# Patient Record
Sex: Male | Born: 1935 | Race: Black or African American | Hispanic: No | Marital: Married | Smoking: Never smoker
Health system: Southern US, Community
[De-identification: ages and names within clinical notes are randomized; demographics above are authoritative.]

## PROBLEM LIST (undated history)

## (undated) DIAGNOSIS — J1100 Influenza due to unidentified influenza virus with unspecified type of pneumonia: Secondary | ICD-10-CM

## (undated) DIAGNOSIS — I10 Essential (primary) hypertension: Secondary | ICD-10-CM

## (undated) DIAGNOSIS — S14109A Unspecified injury at unspecified level of cervical spinal cord, initial encounter: Secondary | ICD-10-CM

## (undated) DIAGNOSIS — K8050 Calculus of bile duct without cholangitis or cholecystitis without obstruction: Secondary | ICD-10-CM

## (undated) DIAGNOSIS — Q453 Other congenital malformations of pancreas and pancreatic duct: Secondary | ICD-10-CM

## (undated) HISTORY — PX: CERVICAL DISC SURGERY: SHX588

## (undated) HISTORY — PX: PROSTATE SURGERY: SHX751

## (undated) HISTORY — PX: PARTIAL COLECTOMY: SHX5273

---

## 2001-11-17 ENCOUNTER — Encounter (HOSPITAL_COMMUNITY): Admission: RE | Admit: 2001-11-17 | Discharge: 2001-12-17 | Payer: Self-pay | Admitting: Family Medicine

## 2002-02-17 ENCOUNTER — Inpatient Hospital Stay (HOSPITAL_COMMUNITY): Admission: AC | Admit: 2002-02-17 | Discharge: 2002-03-05 | Payer: Self-pay

## 2002-02-17 ENCOUNTER — Encounter: Payer: Self-pay | Admitting: Surgery

## 2002-02-17 ENCOUNTER — Encounter: Payer: Self-pay | Admitting: Emergency Medicine

## 2002-02-18 ENCOUNTER — Encounter: Payer: Self-pay | Admitting: General Surgery

## 2002-02-19 ENCOUNTER — Encounter: Payer: Self-pay | Admitting: General Surgery

## 2002-02-21 ENCOUNTER — Encounter: Payer: Self-pay | Admitting: General Surgery

## 2002-02-22 ENCOUNTER — Encounter: Payer: Self-pay | Admitting: Surgery

## 2002-03-05 ENCOUNTER — Inpatient Hospital Stay (HOSPITAL_COMMUNITY)
Admission: RE | Admit: 2002-03-05 | Discharge: 2002-05-03 | Payer: Self-pay | Admitting: Physical Medicine & Rehabilitation

## 2002-03-09 ENCOUNTER — Encounter: Payer: Self-pay | Admitting: Physical Medicine & Rehabilitation

## 2002-03-15 ENCOUNTER — Encounter: Payer: Self-pay | Admitting: Physical Medicine & Rehabilitation

## 2002-03-21 ENCOUNTER — Encounter: Payer: Self-pay | Admitting: Physical Medicine & Rehabilitation

## 2002-03-23 ENCOUNTER — Encounter: Payer: Self-pay | Admitting: Physical Medicine & Rehabilitation

## 2002-03-31 ENCOUNTER — Encounter: Payer: Self-pay | Admitting: Physical Medicine & Rehabilitation

## 2002-04-04 ENCOUNTER — Encounter: Payer: Self-pay | Admitting: Physical Medicine & Rehabilitation

## 2002-04-20 ENCOUNTER — Encounter: Payer: Self-pay | Admitting: Physical Medicine & Rehabilitation

## 2002-07-03 ENCOUNTER — Encounter (HOSPITAL_COMMUNITY)
Admission: RE | Admit: 2002-07-03 | Discharge: 2002-08-02 | Payer: Self-pay | Admitting: Physical Medicine & Rehabilitation

## 2002-08-03 ENCOUNTER — Encounter
Admission: RE | Admit: 2002-08-03 | Discharge: 2002-09-02 | Payer: Self-pay | Admitting: Physical Medicine & Rehabilitation

## 2002-09-03 ENCOUNTER — Encounter (HOSPITAL_COMMUNITY)
Admission: RE | Admit: 2002-09-03 | Discharge: 2002-10-03 | Payer: Self-pay | Admitting: Physical Medicine & Rehabilitation

## 2002-10-03 ENCOUNTER — Encounter (HOSPITAL_COMMUNITY)
Admission: RE | Admit: 2002-10-03 | Discharge: 2002-11-02 | Payer: Self-pay | Admitting: Physical Medicine & Rehabilitation

## 2002-11-05 ENCOUNTER — Encounter (HOSPITAL_COMMUNITY)
Admission: RE | Admit: 2002-11-05 | Discharge: 2002-12-05 | Payer: Self-pay | Admitting: Physical Medicine & Rehabilitation

## 2002-11-16 ENCOUNTER — Encounter
Admission: RE | Admit: 2002-11-16 | Discharge: 2003-02-14 | Payer: Self-pay | Admitting: Physical Medicine & Rehabilitation

## 2002-11-16 ENCOUNTER — Encounter: Payer: Self-pay | Admitting: *Deleted

## 2002-11-16 ENCOUNTER — Emergency Department (HOSPITAL_COMMUNITY): Admission: EM | Admit: 2002-11-16 | Discharge: 2002-11-16 | Payer: Self-pay | Admitting: *Deleted

## 2002-12-05 ENCOUNTER — Encounter (HOSPITAL_COMMUNITY)
Admission: RE | Admit: 2002-12-05 | Discharge: 2003-01-04 | Payer: Self-pay | Admitting: Physical Medicine & Rehabilitation

## 2003-01-07 ENCOUNTER — Encounter (HOSPITAL_COMMUNITY)
Admission: RE | Admit: 2003-01-07 | Discharge: 2003-02-06 | Payer: Self-pay | Admitting: Physical Medicine & Rehabilitation

## 2003-02-08 ENCOUNTER — Encounter (HOSPITAL_COMMUNITY)
Admission: RE | Admit: 2003-02-08 | Discharge: 2003-03-10 | Payer: Self-pay | Admitting: Physical Medicine & Rehabilitation

## 2003-03-11 ENCOUNTER — Encounter (HOSPITAL_COMMUNITY)
Admission: RE | Admit: 2003-03-11 | Discharge: 2003-04-10 | Payer: Self-pay | Admitting: Physical Medicine & Rehabilitation

## 2003-03-25 ENCOUNTER — Ambulatory Visit (HOSPITAL_COMMUNITY): Admission: RE | Admit: 2003-03-25 | Discharge: 2003-03-25 | Payer: Self-pay | Admitting: Urology

## 2003-03-25 ENCOUNTER — Encounter: Payer: Self-pay | Admitting: Urology

## 2003-04-11 ENCOUNTER — Encounter: Payer: Self-pay | Admitting: Urology

## 2003-04-11 ENCOUNTER — Ambulatory Visit (HOSPITAL_COMMUNITY): Admission: RE | Admit: 2003-04-11 | Discharge: 2003-04-11 | Payer: Self-pay | Admitting: Urology

## 2003-04-12 ENCOUNTER — Encounter (HOSPITAL_COMMUNITY)
Admission: RE | Admit: 2003-04-12 | Discharge: 2003-05-12 | Payer: Self-pay | Admitting: Physical Medicine & Rehabilitation

## 2003-04-18 ENCOUNTER — Encounter
Admission: RE | Admit: 2003-04-18 | Discharge: 2003-07-17 | Payer: Self-pay | Admitting: Physical Medicine & Rehabilitation

## 2003-05-13 ENCOUNTER — Inpatient Hospital Stay (HOSPITAL_COMMUNITY): Admission: AD | Admit: 2003-05-13 | Discharge: 2003-05-15 | Payer: Self-pay | Admitting: Urology

## 2003-05-16 ENCOUNTER — Encounter (HOSPITAL_COMMUNITY): Admission: RE | Admit: 2003-05-16 | Discharge: 2003-06-15 | Payer: Self-pay | Admitting: Urology

## 2003-06-23 ENCOUNTER — Emergency Department (HOSPITAL_COMMUNITY): Admission: EM | Admit: 2003-06-23 | Discharge: 2003-06-23 | Payer: Self-pay | Admitting: Emergency Medicine

## 2003-07-18 ENCOUNTER — Encounter
Admission: RE | Admit: 2003-07-18 | Discharge: 2003-10-16 | Payer: Self-pay | Admitting: Physical Medicine & Rehabilitation

## 2003-10-22 ENCOUNTER — Encounter
Admission: RE | Admit: 2003-10-22 | Discharge: 2004-01-20 | Payer: Self-pay | Admitting: Physical Medicine & Rehabilitation

## 2003-12-23 ENCOUNTER — Ambulatory Visit (HOSPITAL_COMMUNITY): Admission: RE | Admit: 2003-12-23 | Discharge: 2003-12-23 | Payer: Self-pay | Admitting: Family Medicine

## 2003-12-25 ENCOUNTER — Inpatient Hospital Stay (HOSPITAL_COMMUNITY): Admission: EM | Admit: 2003-12-25 | Discharge: 2004-01-04 | Payer: Self-pay | Admitting: Emergency Medicine

## 2004-01-16 ENCOUNTER — Ambulatory Visit (HOSPITAL_COMMUNITY): Admission: RE | Admit: 2004-01-16 | Discharge: 2004-01-16 | Payer: Self-pay | Admitting: Family Medicine

## 2004-02-20 ENCOUNTER — Encounter
Admission: RE | Admit: 2004-02-20 | Discharge: 2004-05-20 | Payer: Self-pay | Admitting: Physical Medicine & Rehabilitation

## 2004-03-17 ENCOUNTER — Encounter
Admission: RE | Admit: 2004-03-17 | Discharge: 2004-06-15 | Payer: Self-pay | Admitting: Physical Medicine & Rehabilitation

## 2004-03-19 ENCOUNTER — Ambulatory Visit: Payer: Self-pay | Admitting: Physical Medicine & Rehabilitation

## 2004-05-21 ENCOUNTER — Encounter
Admission: RE | Admit: 2004-05-21 | Discharge: 2004-05-22 | Payer: Self-pay | Admitting: Physical Medicine & Rehabilitation

## 2004-07-14 ENCOUNTER — Encounter
Admission: RE | Admit: 2004-07-14 | Discharge: 2004-10-12 | Payer: Self-pay | Admitting: Physical Medicine & Rehabilitation

## 2004-11-10 ENCOUNTER — Encounter
Admission: RE | Admit: 2004-11-10 | Discharge: 2005-02-08 | Payer: Self-pay | Admitting: Physical Medicine & Rehabilitation

## 2004-11-12 ENCOUNTER — Ambulatory Visit: Payer: Self-pay | Admitting: Physical Medicine & Rehabilitation

## 2004-12-11 ENCOUNTER — Encounter (HOSPITAL_BASED_OUTPATIENT_CLINIC_OR_DEPARTMENT_OTHER): Admission: RE | Admit: 2004-12-11 | Discharge: 2005-03-11 | Payer: Self-pay | Admitting: Surgery

## 2005-01-15 ENCOUNTER — Emergency Department (HOSPITAL_COMMUNITY): Admission: EM | Admit: 2005-01-15 | Discharge: 2005-01-15 | Payer: Self-pay | Admitting: Emergency Medicine

## 2005-02-21 IMAGING — CR DG CHEST 1V
1 series · 1 of 1 positions shown · non-contrast
Comparison: 01/01/2004.

CLINICAL DATA: Followup pneumonia 
 CHEST (ONE VIEW)

[view not recorded]
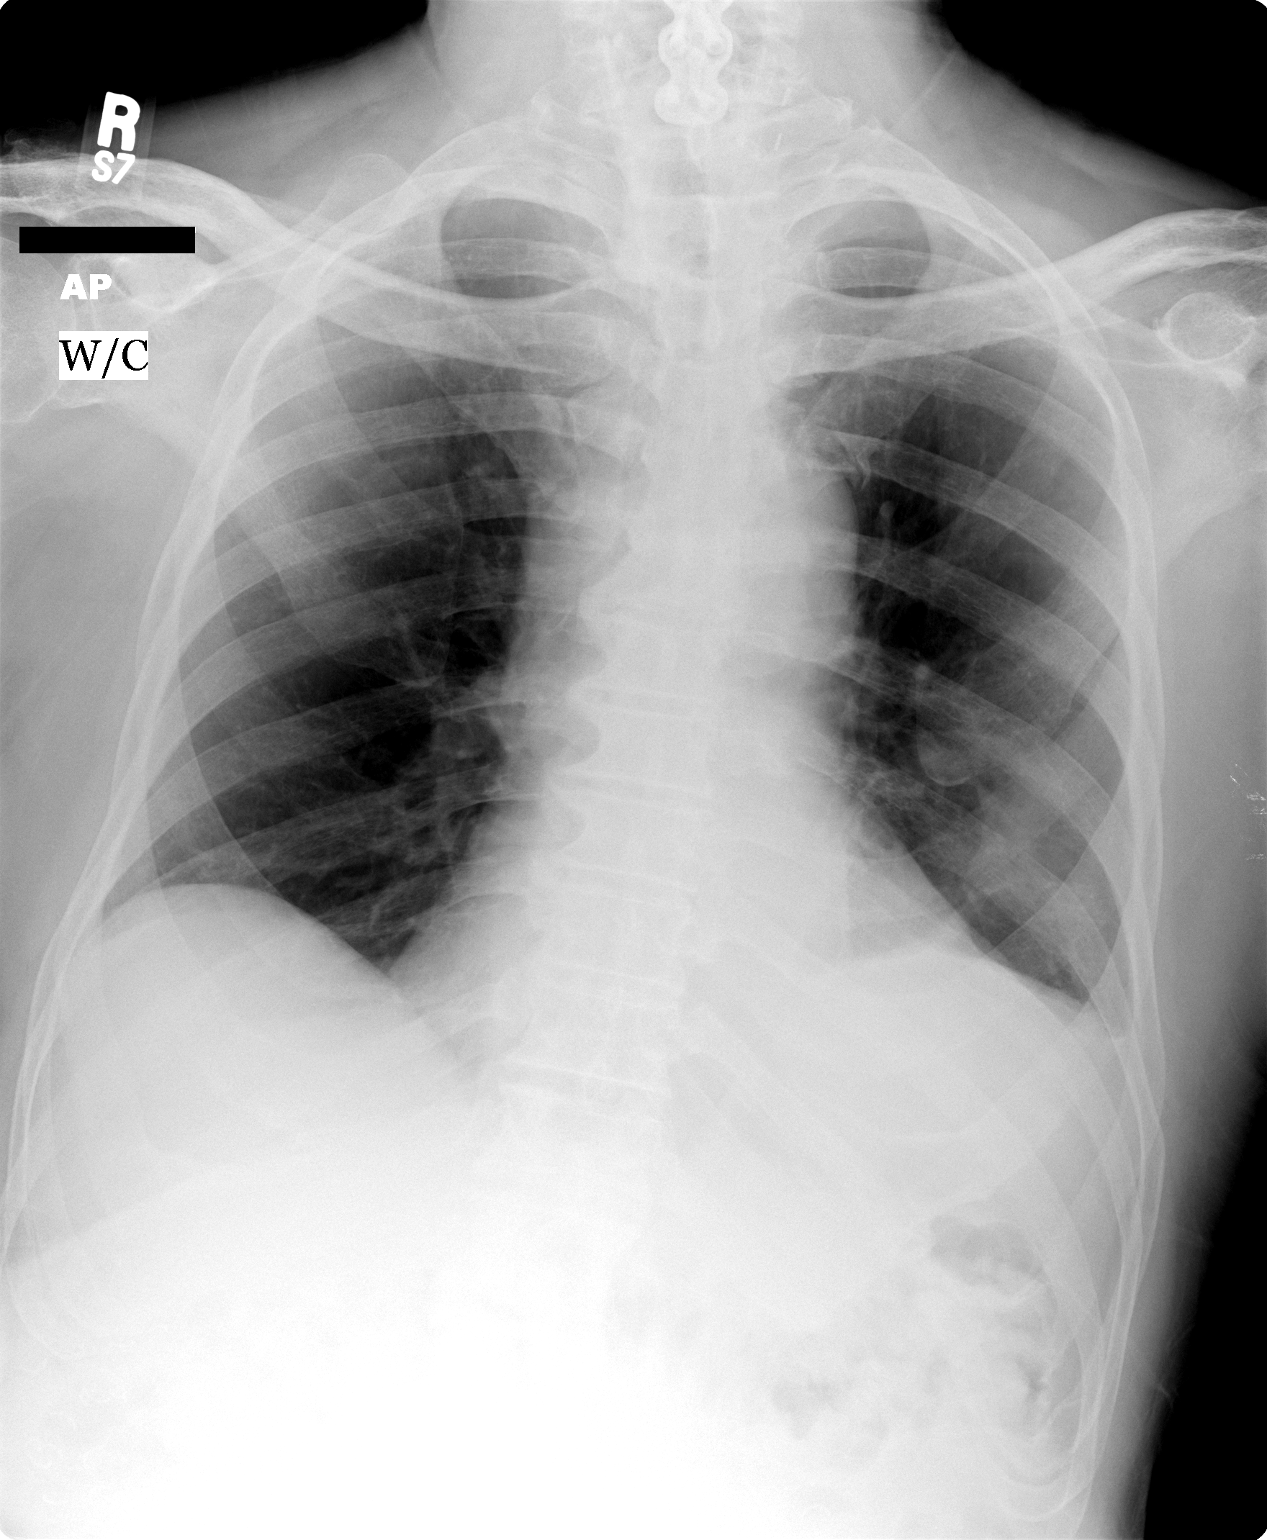

[1 of 1 positions shown; findings below may reference images not displayed]

Upper normal heart size.  Stable mediastinal contours and vascularity.  Minimal left base atelectasis with improved aeration of left lower lobe and near complete resolution of left lower lobe process since previous exam. Small left pleural effusion subpulmonic persists.  Remaining lungs clear.  
 IMPRESSION
 Improved aeration left lower lobe though minimal basilar atelectasis and small subpulmonic left pleural effusion persists.

## 2005-03-17 ENCOUNTER — Ambulatory Visit: Payer: Self-pay | Admitting: Physical Medicine & Rehabilitation

## 2005-03-17 ENCOUNTER — Encounter
Admission: RE | Admit: 2005-03-17 | Discharge: 2005-06-15 | Payer: Self-pay | Admitting: Physical Medicine & Rehabilitation

## 2005-06-10 ENCOUNTER — Ambulatory Visit (HOSPITAL_COMMUNITY): Admission: RE | Admit: 2005-06-10 | Discharge: 2005-06-10 | Payer: Self-pay | Admitting: Family Medicine

## 2005-09-08 ENCOUNTER — Encounter
Admission: RE | Admit: 2005-09-08 | Discharge: 2005-12-07 | Payer: Self-pay | Admitting: Physical Medicine & Rehabilitation

## 2005-09-08 ENCOUNTER — Ambulatory Visit: Payer: Self-pay | Admitting: Physical Medicine & Rehabilitation

## 2006-03-02 ENCOUNTER — Encounter
Admission: RE | Admit: 2006-03-02 | Discharge: 2006-05-31 | Payer: Self-pay | Admitting: Physical Medicine & Rehabilitation

## 2006-03-15 ENCOUNTER — Ambulatory Visit: Payer: Self-pay | Admitting: Family Medicine

## 2006-03-28 ENCOUNTER — Ambulatory Visit (HOSPITAL_COMMUNITY): Admission: RE | Admit: 2006-03-28 | Discharge: 2006-03-28 | Payer: Self-pay | Admitting: Family Medicine

## 2006-03-30 ENCOUNTER — Encounter (INDEPENDENT_AMBULATORY_CARE_PROVIDER_SITE_OTHER): Payer: Self-pay | Admitting: Family Medicine

## 2006-03-30 LAB — CONVERTED CEMR LAB: WBC, blood: 8.5 10*3/uL

## 2006-04-01 ENCOUNTER — Ambulatory Visit: Payer: Self-pay | Admitting: Family Medicine

## 2006-04-14 ENCOUNTER — Ambulatory Visit: Payer: Self-pay | Admitting: Physical Medicine & Rehabilitation

## 2006-04-29 ENCOUNTER — Ambulatory Visit: Payer: Self-pay | Admitting: Family Medicine

## 2006-05-09 ENCOUNTER — Encounter (INDEPENDENT_AMBULATORY_CARE_PROVIDER_SITE_OTHER): Payer: Self-pay | Admitting: Family Medicine

## 2006-05-09 ENCOUNTER — Ambulatory Visit (HOSPITAL_COMMUNITY): Admission: RE | Admit: 2006-05-09 | Discharge: 2006-05-09 | Payer: Self-pay | Admitting: Family Medicine

## 2006-05-27 ENCOUNTER — Ambulatory Visit: Payer: Self-pay | Admitting: Family Medicine

## 2006-06-06 ENCOUNTER — Encounter: Payer: Self-pay | Admitting: Family Medicine

## 2006-06-06 DIAGNOSIS — F329 Major depressive disorder, single episode, unspecified: Secondary | ICD-10-CM

## 2006-06-06 DIAGNOSIS — N319 Neuromuscular dysfunction of bladder, unspecified: Secondary | ICD-10-CM

## 2006-06-06 DIAGNOSIS — R32 Unspecified urinary incontinence: Secondary | ICD-10-CM | POA: Insufficient documentation

## 2006-06-06 DIAGNOSIS — K5090 Crohn's disease, unspecified, without complications: Secondary | ICD-10-CM | POA: Insufficient documentation

## 2006-06-06 DIAGNOSIS — N183 Chronic kidney disease, stage 3 (moderate): Secondary | ICD-10-CM

## 2006-06-06 DIAGNOSIS — K589 Irritable bowel syndrome without diarrhea: Secondary | ICD-10-CM

## 2006-06-06 DIAGNOSIS — E785 Hyperlipidemia, unspecified: Secondary | ICD-10-CM

## 2006-06-06 DIAGNOSIS — IMO0002 Reserved for concepts with insufficient information to code with codable children: Secondary | ICD-10-CM

## 2006-06-27 ENCOUNTER — Ambulatory Visit: Payer: Self-pay | Admitting: Family Medicine

## 2006-06-30 ENCOUNTER — Emergency Department (HOSPITAL_COMMUNITY): Admission: EM | Admit: 2006-06-30 | Discharge: 2006-06-30 | Payer: Self-pay | Admitting: Emergency Medicine

## 2006-07-01 ENCOUNTER — Inpatient Hospital Stay (HOSPITAL_COMMUNITY): Admission: EM | Admit: 2006-07-01 | Discharge: 2006-07-05 | Payer: Self-pay | Admitting: Emergency Medicine

## 2006-08-05 ENCOUNTER — Ambulatory Visit: Payer: Self-pay | Admitting: Family Medicine

## 2006-09-01 ENCOUNTER — Ambulatory Visit: Payer: Self-pay | Admitting: Family Medicine

## 2006-09-08 ENCOUNTER — Encounter (INDEPENDENT_AMBULATORY_CARE_PROVIDER_SITE_OTHER): Payer: Self-pay | Admitting: Family Medicine

## 2006-09-08 DIAGNOSIS — G8250 Quadriplegia, unspecified: Secondary | ICD-10-CM

## 2006-09-09 ENCOUNTER — Telehealth (INDEPENDENT_AMBULATORY_CARE_PROVIDER_SITE_OTHER): Payer: Self-pay | Admitting: Family Medicine

## 2006-09-28 ENCOUNTER — Encounter
Admission: RE | Admit: 2006-09-28 | Discharge: 2006-12-27 | Payer: Self-pay | Admitting: Physical Medicine & Rehabilitation

## 2006-09-28 ENCOUNTER — Ambulatory Visit: Payer: Self-pay | Admitting: Physical Medicine & Rehabilitation

## 2006-10-12 ENCOUNTER — Ambulatory Visit: Payer: Self-pay | Admitting: Family Medicine

## 2006-10-21 ENCOUNTER — Telehealth (INDEPENDENT_AMBULATORY_CARE_PROVIDER_SITE_OTHER): Payer: Self-pay | Admitting: Family Medicine

## 2006-10-26 ENCOUNTER — Encounter (INDEPENDENT_AMBULATORY_CARE_PROVIDER_SITE_OTHER): Payer: Self-pay | Admitting: Family Medicine

## 2006-10-26 ENCOUNTER — Encounter (HOSPITAL_COMMUNITY): Admission: RE | Admit: 2006-10-26 | Discharge: 2006-11-25 | Payer: Self-pay | Admitting: Family Medicine

## 2006-11-02 ENCOUNTER — Encounter (INDEPENDENT_AMBULATORY_CARE_PROVIDER_SITE_OTHER): Payer: Self-pay | Admitting: Family Medicine

## 2006-11-04 ENCOUNTER — Encounter (INDEPENDENT_AMBULATORY_CARE_PROVIDER_SITE_OTHER): Payer: Self-pay | Admitting: Family Medicine

## 2006-11-17 ENCOUNTER — Encounter (INDEPENDENT_AMBULATORY_CARE_PROVIDER_SITE_OTHER): Payer: Self-pay | Admitting: Family Medicine

## 2006-11-24 ENCOUNTER — Encounter (INDEPENDENT_AMBULATORY_CARE_PROVIDER_SITE_OTHER): Payer: Self-pay | Admitting: Family Medicine

## 2006-12-12 ENCOUNTER — Encounter (INDEPENDENT_AMBULATORY_CARE_PROVIDER_SITE_OTHER): Payer: Self-pay | Admitting: Family Medicine

## 2006-12-21 ENCOUNTER — Encounter (INDEPENDENT_AMBULATORY_CARE_PROVIDER_SITE_OTHER): Payer: Self-pay | Admitting: Family Medicine

## 2007-01-09 ENCOUNTER — Encounter (INDEPENDENT_AMBULATORY_CARE_PROVIDER_SITE_OTHER): Payer: Self-pay | Admitting: Family Medicine

## 2007-01-11 ENCOUNTER — Encounter (INDEPENDENT_AMBULATORY_CARE_PROVIDER_SITE_OTHER): Payer: Self-pay | Admitting: Family Medicine

## 2007-01-12 ENCOUNTER — Ambulatory Visit: Payer: Self-pay | Admitting: Family Medicine

## 2007-01-31 ENCOUNTER — Encounter: Admission: RE | Admit: 2007-01-31 | Discharge: 2007-01-31 | Payer: Self-pay | Admitting: Family Medicine

## 2007-02-03 ENCOUNTER — Telehealth (INDEPENDENT_AMBULATORY_CARE_PROVIDER_SITE_OTHER): Payer: Self-pay | Admitting: *Deleted

## 2007-02-06 ENCOUNTER — Telehealth (INDEPENDENT_AMBULATORY_CARE_PROVIDER_SITE_OTHER): Payer: Self-pay | Admitting: *Deleted

## 2007-02-06 ENCOUNTER — Ambulatory Visit: Payer: Self-pay | Admitting: Family Medicine

## 2007-02-07 ENCOUNTER — Telehealth (INDEPENDENT_AMBULATORY_CARE_PROVIDER_SITE_OTHER): Payer: Self-pay | Admitting: *Deleted

## 2007-02-07 LAB — CONVERTED CEMR LAB
ALT: 25 units/L (ref 0–53)
AST: 28 units/L (ref 0–37)
Alkaline Phosphatase: 108 units/L (ref 39–117)
Basophils Absolute: 0 10*3/uL (ref 0.0–0.1)
CO2: 18 meq/L — ABNORMAL LOW (ref 19–32)
Chloride: 106 meq/L (ref 96–112)
Creatinine, Ser: 1.92 mg/dL — ABNORMAL HIGH (ref 0.40–1.50)
Hemoglobin: 16 g/dL (ref 13.0–17.0)
Lymphocytes Relative: 35 % (ref 12–46)
Monocytes Absolute: 0.7 10*3/uL (ref 0.2–0.7)
Monocytes Relative: 10 % (ref 3–11)
Neutro Abs: 3.5 10*3/uL (ref 1.7–7.7)
Neutrophils Relative %: 51 % (ref 43–77)
Platelets: 182 10*3/uL (ref 150–400)
RDW: 14.6 % — ABNORMAL HIGH (ref 11.5–14.0)
Total Bilirubin: 1 mg/dL (ref 0.3–1.2)
WBC: 6.8 10*3/uL (ref 4.0–10.5)

## 2007-02-09 ENCOUNTER — Encounter (INDEPENDENT_AMBULATORY_CARE_PROVIDER_SITE_OTHER): Payer: Self-pay | Admitting: Family Medicine

## 2007-02-12 ENCOUNTER — Encounter (INDEPENDENT_AMBULATORY_CARE_PROVIDER_SITE_OTHER): Payer: Self-pay | Admitting: Family Medicine

## 2007-02-13 ENCOUNTER — Encounter (INDEPENDENT_AMBULATORY_CARE_PROVIDER_SITE_OTHER): Payer: Self-pay | Admitting: Family Medicine

## 2007-02-20 ENCOUNTER — Ambulatory Visit: Payer: Self-pay | Admitting: Family Medicine

## 2007-02-23 ENCOUNTER — Encounter (INDEPENDENT_AMBULATORY_CARE_PROVIDER_SITE_OTHER): Payer: Self-pay | Admitting: Family Medicine

## 2007-03-15 ENCOUNTER — Ambulatory Visit: Payer: Self-pay | Admitting: Physical Medicine & Rehabilitation

## 2007-03-15 ENCOUNTER — Encounter
Admission: RE | Admit: 2007-03-15 | Discharge: 2007-03-16 | Payer: Self-pay | Admitting: Physical Medicine & Rehabilitation

## 2007-03-31 ENCOUNTER — Encounter (INDEPENDENT_AMBULATORY_CARE_PROVIDER_SITE_OTHER): Payer: Self-pay | Admitting: Family Medicine

## 2007-04-03 ENCOUNTER — Ambulatory Visit: Payer: Self-pay | Admitting: Family Medicine

## 2007-04-03 LAB — CONVERTED CEMR LAB
Cholesterol, target level: 200 mg/dL
LDL Goal: 160 mg/dL

## 2007-04-10 ENCOUNTER — Ambulatory Visit: Payer: Self-pay | Admitting: Family Medicine

## 2007-04-10 ENCOUNTER — Telehealth (INDEPENDENT_AMBULATORY_CARE_PROVIDER_SITE_OTHER): Payer: Self-pay | Admitting: Family Medicine

## 2007-04-12 ENCOUNTER — Encounter (INDEPENDENT_AMBULATORY_CARE_PROVIDER_SITE_OTHER): Payer: Self-pay | Admitting: Family Medicine

## 2007-04-12 LAB — CONVERTED CEMR LAB

## 2007-05-01 ENCOUNTER — Telehealth (INDEPENDENT_AMBULATORY_CARE_PROVIDER_SITE_OTHER): Payer: Self-pay | Admitting: *Deleted

## 2007-05-02 ENCOUNTER — Ambulatory Visit: Payer: Self-pay | Admitting: Family Medicine

## 2007-05-16 ENCOUNTER — Ambulatory Visit: Payer: Self-pay | Admitting: Family Medicine

## 2007-05-17 ENCOUNTER — Encounter (INDEPENDENT_AMBULATORY_CARE_PROVIDER_SITE_OTHER): Payer: Self-pay | Admitting: Family Medicine

## 2007-05-17 ENCOUNTER — Telehealth (INDEPENDENT_AMBULATORY_CARE_PROVIDER_SITE_OTHER): Payer: Self-pay | Admitting: *Deleted

## 2007-05-18 ENCOUNTER — Encounter (INDEPENDENT_AMBULATORY_CARE_PROVIDER_SITE_OTHER): Payer: Self-pay | Admitting: Family Medicine

## 2007-05-22 ENCOUNTER — Ambulatory Visit (HOSPITAL_COMMUNITY): Admission: RE | Admit: 2007-05-22 | Discharge: 2007-05-22 | Payer: Self-pay | Admitting: Family Medicine

## 2007-05-22 ENCOUNTER — Encounter (INDEPENDENT_AMBULATORY_CARE_PROVIDER_SITE_OTHER): Payer: Self-pay | Admitting: Family Medicine

## 2007-05-23 ENCOUNTER — Telehealth (INDEPENDENT_AMBULATORY_CARE_PROVIDER_SITE_OTHER): Payer: Self-pay | Admitting: *Deleted

## 2007-05-23 LAB — CONVERTED CEMR LAB
ALT: 19 units/L (ref 0–53)
AST: 20 units/L (ref 0–37)
Albumin: 4.1 g/dL (ref 3.5–5.2)
Alkaline Phosphatase: 112 units/L (ref 39–117)
Amylase: 77 units/L (ref 0–105)
BUN: 18 mg/dL (ref 6–23)
Chloride: 105 meq/L (ref 96–112)
Cholesterol: 145 mg/dL (ref 0–200)
Glucose, Bld: 81 mg/dL (ref 70–99)
HCT: 49.7 % (ref 39.0–52.0)
Lymphocytes Relative: 34 % (ref 12–46)
Lymphs Abs: 2.6 10*3/uL (ref 0.7–4.0)
MCV: 89.7 fL (ref 78.0–100.0)
Monocytes Absolute: 0.5 10*3/uL (ref 0.1–1.0)
Neutrophils Relative %: 56 % (ref 43–77)
Platelets: 172 10*3/uL (ref 150–400)
Potassium: 4.7 meq/L (ref 3.5–5.3)
RBC: 5.54 M/uL (ref 4.22–5.81)
Sodium: 140 meq/L (ref 135–145)
Total CHOL/HDL Ratio: 2.8
Total Protein: 7.2 g/dL (ref 6.0–8.3)

## 2007-05-30 ENCOUNTER — Ambulatory Visit (HOSPITAL_COMMUNITY): Admission: RE | Admit: 2007-05-30 | Discharge: 2007-05-30 | Payer: Self-pay | Admitting: Family Medicine

## 2007-05-30 ENCOUNTER — Encounter (INDEPENDENT_AMBULATORY_CARE_PROVIDER_SITE_OTHER): Payer: Self-pay | Admitting: Family Medicine

## 2007-06-16 ENCOUNTER — Telehealth (INDEPENDENT_AMBULATORY_CARE_PROVIDER_SITE_OTHER): Payer: Self-pay | Admitting: Family Medicine

## 2007-06-21 ENCOUNTER — Telehealth (INDEPENDENT_AMBULATORY_CARE_PROVIDER_SITE_OTHER): Payer: Self-pay | Admitting: *Deleted

## 2007-06-21 ENCOUNTER — Encounter (INDEPENDENT_AMBULATORY_CARE_PROVIDER_SITE_OTHER): Payer: Self-pay | Admitting: Family Medicine

## 2007-06-21 DIAGNOSIS — J984 Other disorders of lung: Secondary | ICD-10-CM | POA: Insufficient documentation

## 2007-06-26 ENCOUNTER — Encounter (INDEPENDENT_AMBULATORY_CARE_PROVIDER_SITE_OTHER): Payer: Self-pay | Admitting: Family Medicine

## 2007-07-10 ENCOUNTER — Ambulatory Visit: Payer: Self-pay | Admitting: Family Medicine

## 2007-07-11 ENCOUNTER — Telehealth (INDEPENDENT_AMBULATORY_CARE_PROVIDER_SITE_OTHER): Payer: Self-pay | Admitting: *Deleted

## 2007-07-11 ENCOUNTER — Telehealth (INDEPENDENT_AMBULATORY_CARE_PROVIDER_SITE_OTHER): Payer: Self-pay | Admitting: Family Medicine

## 2007-07-20 ENCOUNTER — Ambulatory Visit (HOSPITAL_COMMUNITY): Admission: RE | Admit: 2007-07-20 | Discharge: 2007-07-20 | Payer: Self-pay | Admitting: Pulmonary Disease

## 2007-07-24 ENCOUNTER — Encounter (INDEPENDENT_AMBULATORY_CARE_PROVIDER_SITE_OTHER): Payer: Self-pay | Admitting: Family Medicine

## 2007-08-15 ENCOUNTER — Encounter (INDEPENDENT_AMBULATORY_CARE_PROVIDER_SITE_OTHER): Payer: Self-pay | Admitting: Family Medicine

## 2007-08-18 ENCOUNTER — Encounter (INDEPENDENT_AMBULATORY_CARE_PROVIDER_SITE_OTHER): Payer: Self-pay | Admitting: Family Medicine

## 2007-08-21 ENCOUNTER — Ambulatory Visit: Payer: Self-pay | Admitting: Family Medicine

## 2007-08-24 ENCOUNTER — Ambulatory Visit: Payer: Self-pay | Admitting: Family Medicine

## 2007-08-24 DIAGNOSIS — L89309 Pressure ulcer of unspecified buttock, unspecified stage: Secondary | ICD-10-CM | POA: Insufficient documentation

## 2007-08-27 ENCOUNTER — Encounter (INDEPENDENT_AMBULATORY_CARE_PROVIDER_SITE_OTHER): Payer: Self-pay | Admitting: Family Medicine

## 2007-09-19 ENCOUNTER — Encounter (INDEPENDENT_AMBULATORY_CARE_PROVIDER_SITE_OTHER): Payer: Self-pay | Admitting: Family Medicine

## 2007-09-22 ENCOUNTER — Ambulatory Visit: Payer: Self-pay | Admitting: Family Medicine

## 2007-09-22 LAB — CONVERTED CEMR LAB
Bilirubin Urine: NEGATIVE
Ketones, urine, test strip: NEGATIVE
Specific Gravity, Urine: 1.01
Urobilinogen, UA: 0.2

## 2007-09-23 ENCOUNTER — Encounter (INDEPENDENT_AMBULATORY_CARE_PROVIDER_SITE_OTHER): Payer: Self-pay | Admitting: Family Medicine

## 2007-09-23 LAB — CONVERTED CEMR LAB
AST: 25 units/L (ref 0–37)
Alkaline Phosphatase: 125 units/L — ABNORMAL HIGH (ref 39–117)
CO2: 16 meq/L — ABNORMAL LOW (ref 19–32)
Calcium: 8.9 mg/dL (ref 8.4–10.5)
Creatinine, Ser: 1.48 mg/dL (ref 0.40–1.50)
Eosinophils Absolute: 0.2 10*3/uL (ref 0.0–0.7)
Eosinophils Relative: 3 % (ref 0–5)
Glucose, Bld: 82 mg/dL (ref 70–99)
HCT: 43.8 % (ref 39.0–52.0)
MCHC: 35.2 g/dL (ref 30.0–36.0)
MCV: 87.3 fL (ref 78.0–100.0)
Monocytes Relative: 8 % (ref 3–12)
Potassium: 4.3 meq/L (ref 3.5–5.3)
RDW: 14.1 % (ref 11.5–15.5)
Sodium: 139 meq/L (ref 135–145)
Total Bilirubin: 1.1 mg/dL (ref 0.3–1.2)
Total Protein: 7.4 g/dL (ref 6.0–8.3)

## 2007-09-25 ENCOUNTER — Telehealth (INDEPENDENT_AMBULATORY_CARE_PROVIDER_SITE_OTHER): Payer: Self-pay | Admitting: *Deleted

## 2007-09-26 ENCOUNTER — Telehealth (INDEPENDENT_AMBULATORY_CARE_PROVIDER_SITE_OTHER): Payer: Self-pay | Admitting: Family Medicine

## 2007-09-28 ENCOUNTER — Telehealth (INDEPENDENT_AMBULATORY_CARE_PROVIDER_SITE_OTHER): Payer: Self-pay | Admitting: Family Medicine

## 2007-11-06 ENCOUNTER — Ambulatory Visit: Payer: Self-pay | Admitting: Family Medicine

## 2008-02-05 ENCOUNTER — Ambulatory Visit: Payer: Self-pay | Admitting: Family Medicine

## 2008-04-11 ENCOUNTER — Ambulatory Visit: Payer: Self-pay | Admitting: Physical Medicine & Rehabilitation

## 2008-04-11 ENCOUNTER — Encounter
Admission: RE | Admit: 2008-04-11 | Discharge: 2008-04-11 | Payer: Self-pay | Admitting: Physical Medicine & Rehabilitation

## 2008-05-06 ENCOUNTER — Ambulatory Visit: Payer: Self-pay | Admitting: Family Medicine

## 2008-05-08 LAB — CONVERTED CEMR LAB
Alkaline Phosphatase: 118 units/L — ABNORMAL HIGH (ref 39–117)
CO2: 20 meq/L (ref 19–32)
Calcium: 9.3 mg/dL (ref 8.4–10.5)
Creatinine, Ser: 1.76 mg/dL — ABNORMAL HIGH (ref 0.40–1.50)
Glucose, Bld: 72 mg/dL (ref 70–99)
Total Bilirubin: 0.9 mg/dL (ref 0.3–1.2)

## 2008-09-13 ENCOUNTER — Ambulatory Visit: Payer: Self-pay | Admitting: Family Medicine

## 2008-09-13 LAB — CONVERTED CEMR LAB
Bilirubin Urine: NEGATIVE
Glucose, Urine, Semiquant: NEGATIVE
Protein, U semiquant: 30
Specific Gravity, Urine: 1.015
pH: 5.5

## 2008-09-14 ENCOUNTER — Encounter (INDEPENDENT_AMBULATORY_CARE_PROVIDER_SITE_OTHER): Payer: Self-pay | Admitting: Family Medicine

## 2008-09-14 LAB — CONVERTED CEMR LAB: RBC / HPF: NONE SEEN (ref <3)

## 2008-09-26 ENCOUNTER — Encounter (INDEPENDENT_AMBULATORY_CARE_PROVIDER_SITE_OTHER): Payer: Self-pay | Admitting: Family Medicine

## 2008-09-30 LAB — CONVERTED CEMR LAB
Albumin: 4 g/dL (ref 3.5–5.2)
Alkaline Phosphatase: 138 units/L — ABNORMAL HIGH (ref 39–117)
Chloride: 107 meq/L (ref 96–112)
Glucose, Bld: 75 mg/dL (ref 70–99)
HCT: 45.9 % (ref 39.0–52.0)
HDL: 48 mg/dL (ref >39)
Hemoglobin: 14.8 g/dL (ref 13.0–17.0)
LDL Cholesterol: 61 mg/dL (ref 0–99)
Lymphocytes Relative: 46 % (ref 12–46)
MCHC: 32.2 g/dL (ref 30.0–36.0)
MCV: 86.9 fL (ref 78.0–100.0)
Neutro Abs: 3 10*3/uL (ref 1.7–7.7)
Neutrophils Relative %: 44 % (ref 43–77)
Platelets: 157 10*3/uL (ref 150–400)
Potassium: 4.3 meq/L (ref 3.5–5.3)
Sodium: 141 meq/L (ref 135–145)
Total CHOL/HDL Ratio: 2.9
VLDL: 28 mg/dL (ref 0–40)

## 2008-10-04 ENCOUNTER — Ambulatory Visit: Payer: Self-pay | Admitting: Family Medicine

## 2008-10-04 DIAGNOSIS — L218 Other seborrheic dermatitis: Secondary | ICD-10-CM

## 2008-10-04 LAB — CONVERTED CEMR LAB
Bilirubin Urine: NEGATIVE
Ketones, urine, test strip: NEGATIVE
Nitrite: POSITIVE
Protein, U semiquant: 30
Urobilinogen, UA: 0.2
pH: 5.5

## 2008-10-10 ENCOUNTER — Ambulatory Visit (HOSPITAL_COMMUNITY): Admission: RE | Admit: 2008-10-10 | Discharge: 2008-10-10 | Payer: Self-pay | Admitting: Psychiatry

## 2008-10-22 ENCOUNTER — Encounter
Admission: RE | Admit: 2008-10-22 | Discharge: 2008-10-30 | Payer: Self-pay | Admitting: Physical Medicine & Rehabilitation

## 2008-10-24 ENCOUNTER — Ambulatory Visit: Payer: Self-pay | Admitting: Physical Medicine & Rehabilitation

## 2008-12-06 ENCOUNTER — Ambulatory Visit: Payer: Self-pay | Admitting: Family Medicine

## 2008-12-27 ENCOUNTER — Ambulatory Visit: Payer: Self-pay | Admitting: Family Medicine

## 2008-12-27 DIAGNOSIS — R1310 Dysphagia, unspecified: Secondary | ICD-10-CM | POA: Insufficient documentation

## 2008-12-30 ENCOUNTER — Ambulatory Visit (HOSPITAL_COMMUNITY): Admission: RE | Admit: 2008-12-30 | Discharge: 2008-12-30 | Payer: Self-pay | Admitting: Family Medicine

## 2008-12-30 ENCOUNTER — Encounter (INDEPENDENT_AMBULATORY_CARE_PROVIDER_SITE_OTHER): Payer: Self-pay | Admitting: Family Medicine

## 2008-12-30 DIAGNOSIS — M542 Cervicalgia: Secondary | ICD-10-CM

## 2008-12-31 ENCOUNTER — Ambulatory Visit (HOSPITAL_COMMUNITY): Admission: RE | Admit: 2008-12-31 | Discharge: 2008-12-31 | Payer: Self-pay | Admitting: Family Medicine

## 2009-01-27 ENCOUNTER — Telehealth (INDEPENDENT_AMBULATORY_CARE_PROVIDER_SITE_OTHER): Payer: Self-pay | Admitting: *Deleted

## 2009-03-11 ENCOUNTER — Ambulatory Visit: Payer: Self-pay | Admitting: Family Medicine

## 2009-03-25 ENCOUNTER — Encounter (HOSPITAL_COMMUNITY): Admission: RE | Admit: 2009-03-25 | Discharge: 2009-04-10 | Payer: Self-pay | Admitting: Family Medicine

## 2009-04-03 ENCOUNTER — Encounter (INDEPENDENT_AMBULATORY_CARE_PROVIDER_SITE_OTHER): Payer: Self-pay | Admitting: Family Medicine

## 2010-06-22 ENCOUNTER — Ambulatory Visit (HOSPITAL_COMMUNITY)
Admission: RE | Admit: 2010-06-22 | Discharge: 2010-06-22 | Payer: Self-pay | Source: Home / Self Care | Attending: Urology | Admitting: Urology

## 2010-08-02 ENCOUNTER — Encounter: Payer: Self-pay | Admitting: Family Medicine
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
## 2010-11-27 NOTE — Group Therapy Note (Signed)
NAME:  TREYOR, CAVAZOS                         ACCOUNT NO.:  [id number]   MEDICAL RECORD NO.:  [id number]                   PATIENT TYPE:  INP   LOCATION:  A215                                 FACILITY:  [facility]   PHYSICIAN:  Edward L. Luan Pulling, M.D.             DATE OF BIRTH:  [birth year]   DATE OF PROCEDURE:  DATE OF DISCHARGE:                                   PROGRESS NOTE   PROBLEMS:  1. Pneumonia.  2. Quadriparesis.   SUBJECTIVE:  Mr. Postlewaite seems to be doing a little better.  He has no new  complaints.  He is still coughing up a markedly discolored sputum with blood  tinge.   PHYSICAL EXAMINATION:  VITAL SIGNS:  Temperature 97.8; pulse 106;  respirations 20; blood pressure 157/81; O2 saturations 97% on four liters.  CHEST:  Still with decreased breath sounds on the left.   White count now is down to 8,100 from 15,300.   ASSESSMENT:  I think he is improving, albeit slowly.   PLAN:  Continue with his treatments and medications.  He did have a swallow  evaluation and was able to swallow for the speech therapist.  This morning  he seems a bit sluggish and I wonder if it is related to simply that he was  asleep and that may have been the reason that he was having swallowing  trouble earlier.      ___________________________________________                                            Jasper Loser Luan Pulling, M.D.   ELH/MEDQ  D:  12/27/2003  T:  12/27/2003  Job:  [id number]
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
## 2010-11-27 NOTE — Group Therapy Note (Signed)
NAME:  Christopher Mcdonald, Christopher Mcdonald                         ACCOUNT NO.:  [id number]   MEDICAL RECORD NO.:  [id number]                   PATIENT TYPE:  INP   LOCATION:  A215                                 FACILITY:  [facility]   PHYSICIAN:  Edward L. Luan Pulling, M.D.             DATE OF BIRTH:  04/02/1923   DATE OF PROCEDURE:  DATE OF DISCHARGE:                                   PROGRESS NOTE   PROBLEMS:  1. Pneumonia.  2. Quadriparesis.   SUBJECTIVE:  Christopher Mcdonald says that he is doing okay.   PHYSICAL EXAMINATION:  VITAL SIGNS:  His temperature is 98.1; pulse 96;  respirations are 20; blood pressure is better at 152/85; O2 saturation is  97% on two liters.  CHEST:  Clearer.   His chest x-ray looked better at last check.   ASSESSMENT:  I think that he is slowly improving.   PLAN:  I would plan to continue current medications and treatments.  Continue with IVs and I am going to plan to sign off at this point.  Thanks  for allowing me to see him with you.  I will be glad to see him in the  future at your request.      ___________________________________________                                            Jasper Loser. Luan Pulling, M.D.   ELH/MEDQ  D:  01/03/2004  T:  01/03/2004  Job:  [id number]
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
## 2010-11-27 NOTE — Group Therapy Note (Signed)
NAME:  BAYLAN, RETTERER                         ACCOUNT NO.:  [id number]   MEDICAL RECORD NO.:  [id number]                   PATIENT TYPE:  INP   LOCATION:  A215                                 FACILITY:  [facility]   PHYSICIAN:  Edward L. Luan Pulling, M.D.             DATE OF BIRTH:  1922/08/17   DATE OF PROCEDURE:  DATE OF DISCHARGE:                                   PROGRESS NOTE   PROBLEMS:  1. Pneumonia.  2. Quadriparesis.   SUBJECTIVE:  Mr. Turowski seems to be feeling a little better.  He was agitated  again last night.  He says that he is breathing well and has no complaints.   PHYSICAL EXAMINATION:  VITAL SIGNS:  His temperature is 97.2; pulse was 118,  but as low as 109; respirations 18; blood pressure 145/92; O2 saturations  97% on four liters.  CHEST:  Fairly clear now.  HEART:  Regular.  ABDOMEN:  Soft.   His electrolytes are normal.  BUN is 17, creatinine 1.9.   He is set for a lateral decubitus chest x-ray.      ___________________________________________                                            Jasper Loser. Luan Pulling, M.D.   Marjean Donna  D:  12/30/2003  T:  12/30/2003  Job:  [id number]

## 2010-11-27 NOTE — Discharge Summary (Signed)
NAME:  Christopher Mcdonald, Christopher Mcdonald                         ACCOUNT NO.:  [id number]   MEDICAL RECORD NO.:  [id number]                   PATIENT TYPE:  INP   LOCATION:  A215                                 FACILITY:  [facility]   PHYSICIAN:  Barrie Folk. Berdine Addison, M.D.                DATE OF BIRTH:  Jan 25, 1923   DATE OF ADMISSION:  12/25/2003  DATE OF DISCHARGE:  01/04/2004                                 DISCHARGE SUMMARY   HISTORY OF PRESENT ILLNESS:  The patient was a 77 year old married disabled  black male from [place], [place].  Chief complaint was productive  cough.  Patient had been experiencing a cough x3 weeks.  Cough had been  productive x8-10 days.  Sputum was described as thick, white, and bloody.  History is also positive for sweaty spell, fever, chills, decreased  appetite, sore throat, runny nose, general malaise, occasional wheezing, and  feeling short of breath.  History was negative for nausea, vomiting, and  diarrhea.  The patient was seen in the office on December 23, 2003 for these  symptoms.  He was treated with Rocephin 1 g IM, p.o. Biaxin, antitussive  medication, and Tylenol for temperature.  White count in the office was  12,200.  Due to persistent symptoms and general malaise the wife was advised  by me to take the patient to the emergency room on the day of admission for  further evaluation and radiological studies.   ALLERGIES:  The patient was not allergic to any known medication.   HABITS:  Positive for former use of ethanol and tobacco products.   FAMILY HISTORY:  Mother deceased age 48 secondary to complications of  stroke; father deceased age 62 secondary to death via gas in [place]; three  brothers living age 60 health unknown, age 87 health unknown, age 50 with  history of hypertension; two daughters living age 2 good health, age 76  good health and one son living age 80 with good health.   PAST MEDICAL HISTORY:  Positive for hospitalization for transurethral  prostatectomy in 1980 at [facility] in [place], [place]; cecal  resection for inflammatory mass in June 1998; peritonitis secondary to  anastomotic leak in December 1998 treated with colostomy; reanastomosis in  September 1999 at [facility] in [place], [place].   HOSPITAL COURSE:  Problem 1. ACUTE LEFT LOWER LOBE PNEUMONIA.  General  appearance revealed a medium frame/medium height black male in no apparent  respiratory distress.  Vitals on admission:  Temperature 103.3, blood  pressure 129/73, pulse 115, respirations 24.  Repeat chest x-ray  demonstrated compared to December 23, 2003 worsening of left lower infiltrative  changes.  The right lung was clear.  There was stable cardiomegaly as read  by Dr. Wanda Plump. Altamese Cabal.  Lungs on admission demonstrated fair air  movement.  On percussion there was tenderness on palpation of left lower  lobe lung field.  Heart revealed audible S1 and S2 without murmur.  Rhythm  was regular.  Rate within normal limits at time of admission to the floor.  Abdomen was positive for old healed mid gastric surgical scar.  Abdomen was  soft and nontender in all four quadrants.  Abdominal exam demonstrated no  palpable mass or organomegaly.  Patient was alert and oriented to person,  place, and time.  He communicated verbally.  Significant labs on admission  were as follows:  White count 15,300, hemoglobin 14.2, hematocrit 42,  platelets 157,000, sodium 133, potassium 4.3, chloride 103, CO2 22, glucose  121, BUN 22, creatinine 2.9.  In the emergency room the patient was treated  with Rocephin 1 g IV and Zithromax 500 mg IV.  Blood cultures were ordered  x2.  Patient was admitted to the medical floor on telemetry.  He was  continued on Rocephin 1 g IV q.24h., Levaquin 750 mg IV q.48h., guaifenesin  600 mg two tablets p.o. b.i.d., oxygen at 4 L/min via nasal cannula  humidified, antitussive medication and pulmonary consult.  A pulmonary  consult was  done by Dr. Sinda Du on December 26, 2003.  His conclusion was  left lobe pneumonia.  The patient was continued on treatment plan.  Moreover, Xopenex neb treatments were implemented on the first day of  admission and continued throughout this hospitalization.  Repeat chest x-ray  on December 29, 2003 revealed a slight increase in left lower lobe lung airspace  disease as read by Dr. Margarette Canada.  Repeat chest x-ray on January 01, 2004 was  read as slightly improved aeration to left lung base.  Still question of  left basilar atelectasis, effusion, or pneumonia as read by Dr. Alfred Levins.  Patient was much improved at time of discharge.  Repeat white count on December 26, 2003 was 10,900.  Repeat white count on December 29, 2003 was 7800.  Repeat  white count on January 02, 2004 was 7900.  Patient's appetite had improved  significantly.  He was alert and oriented to person and place.  He  communicated verbally.  He had no signs of respiratory distress at time of  discharge.  Cough was decreasing in frequency and decrease in bloody sputum  production at time of discharge.  ABGs on room air at time of discharge were  as follows:  pH 7.42, pCO2 of 39.9, pO2 of 68, O2 saturation 95.2%.  The  patient was discharged to his home where he lives with his wife.  He will be  on two antibiotics.  Home health will monitor his respiratory status as an  outpatient.   Problem 2. HYPERTENSION.  During this hospitalization patient experienced  elevated blood pressure.  Catapres-TTS patch was started during this  hospitalization.  Blood pressure at time of discharge was 134/74.  He had no  complaint of chest pain.  Heart rate on morning of discharge was 105.  Chest  x-ray during this hospitalization demonstrated initially cardiomegaly which  was described as stable.  Patient had no edema of extremities at time of  discharge.  He had no edema of face also.  Problem 3. QUADRIPLEGIA SECONDARY TO SPINAL CORD INJURY.  Patient is  not  able to move hands or legs.  He requires total care.  Skin has on sign of a  breakdown.   Problem 4. CROHN'S DISEASE.  Patient is on no medication.  He was taken off  medication by GI specialist at [facility].  He has  not experienced any  bloody stools, complaint of abdominal pain during this hospitalization.   Problem 5. HYPOTONIC NEUROGENIC BLADDER.  Patient is cath'd every day or  every other day as needed by his spouse.  He has experienced good urine  output during this hospitalization.  He is followed by Dr. Michela Pitcher,  urologist, pertaining to this problem.   Problem 6. CHRONIC DEPRESSION.  Patient was continued on Remeron 30 mg p.o.  every bedtime.  Initially he was experiencing crying spells and moods of  melancholy.  His melancholy has resolved at time of discharge.  Mood is up.  Affect is bright.  Outlook is positive.  Patient feels much better overall.   DISPOSITION:  The patient was discharged home on January 04, 2004 and he was  admitted on December 25, 2003.   INSTRUCTION AT TIME OF DISCHARGE:  1. Diet:  No added salt.  2. Activity:  Out of bed to a wheelchair as in the hospital every day.  3. Medications:     a. Histussin HC syrup two teaspoons p.o. q.4h.     b. Guaifenesin 600 mg two tablets p.o. q.12h.     c. Baclofen 10 mg two tablets three times a day.     d. Nephro-Vite one tablet p.o. every day.     e. Remeron 30 mg one tablet every bedtime.     f. Pepcid 20 mg one tablet every day.     g. Catapres-TTS-1 one patch every 7 days for blood pressure.     h. Levaquin 750 mg one tablet every other day.        a. Tylenol 325 mg two tablets p.o. q.4-6h. p.r.n. for temperature 101           or greater.     i. Vantin 200 mg one tablet twice a day for 14 days.   FINAL PRIMARY DIAGNOSIS:  Acute left lower lobe pneumonia.   SECONDARY DIAGNOSES:  1. Chronic renal insufficiency.  2. Crohn's disease, asymptomatic.  3. Quadriplegia secondary to spinal cord injury.  4.  Hypotonic neurogenic bladder.  5. Chronic depression.     ___________________________________________                                         Barrie Folk. Berdine Addison, M.D.   Armanda Heritage  D:  01/04/2004  T:  01/04/2004  Job:  [id number]
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
## 2010-11-27 NOTE — Procedures (Signed)
NAME:  RAVI, KAUK               ACCOUNT NO.:  [id number]   MEDICAL RECORD NO.:  [id number]          PATIENT TYPE:  OUT   LOCATION:  RESP                          FACILITY:  [facility]   PHYSICIAN:  Edward L. Luan Pulling, M.D.DATE OF BIRTH:  1922-08-11   DATE OF PROCEDURE:  DATE OF DISCHARGE:  05/30/2007                            PULMONARY FUNCTION TEST   PULMONARY FUNCTION TEST:   FINDINGS:  1. Spirometry shows a severe ventilatory defect without significant      evidence of obstructive change.  2. Lung volumes show restrictive pulmonary disease.  3. DLCO is mildly reduced.  4. Arterial blood gases are normal.  5. There is no significant bronchodilator effect.  6. This is consistent with the diagnosis of restrictive lung disease.      Edward L. Luan Pulling, M.D.  Electronically Signed     ELH/MEDQ  D:  06/01/2007  T:  06/02/2007  Job:  [id number]   cc:   Weston Settle, M.D.

## 2010-11-27 NOTE — H&P (Signed)
NAMEVONDELL, QUISPE               ACCOUNT NO.:  [id number]   MEDICAL RECORD NO.:  [id number]          PATIENT TYPE:  INP   LOCATION:  [id number]                          FACILITY:  [facility]   PHYSICIAN:  Merry Lofty, MD   DATE OF BIRTH:  Sep 21, 1922   DATE OF ADMISSION:  07/01/2006  DATE OF DISCHARGE:  LH                              HISTORY & PHYSICAL   PMD:  Dr. Ivor Costa.   CHIEF COMPLAINT:  Fever, chills and generalized weakness of 2 days  duration.   HPI:  Mr. Colin is an 77 year old male patient who has a history of  hypertension, kidney problems, history of Crohn's disease and a history  of cardioplegia since 2003.  He came with fever, chills associated with  __________ generalized weakness.  He has also mild cough but he denied  vomiting, no abdominal pain, no urinary complaints, no chest pain.   PAST MEDICAL HISTORY:  1. He has hypertension.  2. He has kidney problems unspecified.  3. He has Crohn's disease which was operated 10 years back and he has      quadriplegia secondary to trauma since 2003.   REVIEW OF SYSTEMS:  Is unremarkable except which was mentioned on the  HPI.   ALLERGY:  HE HAS NO KNOWN DRUG ALLERGY.   SOCIAL HISTORY:  He is married and he is living with his wife, who was  on his bedside during the interview, and he lives in [place].  He has  no history of smoking and he has no history of drinking.   FAMILY HISTORY:  Unremarkable.   PHYSICAL EXAMINATION:  An elderly patient, bedridden, lying in the bed  of the emergency room.  He is not in significant distress.  VITAL SIGNS:  Blood pressure is 93/54, temperature is 102.8, heart rate  is 100-128 and respiratory rate is 24 per minute.  HEENT:  He has pink conjunctiva and no jaundice.  Pupils are equal and  reactive.  CHEST:  With good air entry bilaterally.  There is no creps appreciated.  CVS:  S1, S2 regular, there is no murmur appreciated.  ABDOMINAL:  The abdomen is full.  There is not any  tenderness.  Bowel  sounds are normoactive.  No organomegaly is appreciated.  There is an  old surgical scar on the abdomen.  GU:  There is no abnormality detected.  EXTREMITIES:  There is not any edema.  MUSCULOSKELETAL SYSTEM:  Normal CNS, he is alert but demented and he is  not well communicative.   LABS:  White blood cell count is 14.8, hematocrit is 44.8, hemoglobin is  14.8, and platelet count is 150,000.  Neutrophil is 89%.  On chemistry,  sodium is 138, potassium is 4.3, chloride is 104, bicarb is 22 and the  BUN is 29 and the creatinine is 3.22, calcium is 8.5.  The other labs  are pending.  Chest x-ray __________ there is increased vascular  marking, there is no significant finding.   ASSESSMENT:  1. Sepsis with unknown focus.  He has fever, he has high white blood      count,  low blood pressure.  2. History of hypertension.  3. Quadriplegia since 2003.  4. History of Crohn's disease.   Admitted to the Second Floor.  The plan is to give him normal saline,  antibiotics, send blood culture, UA and urine culture.  To watch for  fall, aspiration and to follow labs in a.m. and follow vital signs  strictly and manage accordingly.                                            ______________________________  Merry Lofty, MD     TM/MEDQ  D:  07/01/2006  T:  07/03/2006  Job:  [id number]

## 2010-11-27 NOTE — Discharge Summary (Signed)
NAMECRAMER, MCNERNEY               ACCOUNT NO.:  [id number]   MEDICAL RECORD NO.:  [id number]          PATIENT TYPE:  INP   LOCATION:  A211                          FACILITY:  [facility]   PHYSICIAN:  Bonnielee Haff, MD     DATE OF BIRTH:  Aug 01, 1922   DATE OF ADMISSION:  07/01/2006  DATE OF DISCHARGE:  12/25/2007LH                               DISCHARGE SUMMARY   PRIMARY CARE PHYSICIAN:  Dr. Roslynn Amble.  Patient is also seen by  Dr. Michela Pitcher for his bladder issues, because of his quadriplegia.   DISCHARGE DIAGNOSES:  1. Urinary tract infection with sepsis, improved.  2. Gram-negative bacteremia, one out of 4 bottles, stable.  3. History of spastic quadriparesis, stable.  4. History of hypertension, stable.   Please review H&P dictated by Dr. Matilde Bash details regarding patient's  presenting illness.   BRIEF HOSPITAL COURSE:  1. Urinary tract infection.  Patient is a 77 year old African-American      male who has spastic quadriparesis who presented with fever, chills      and weakness.  He was found to have evidence for a urinary tract      infection.  The patient was febrile, blood pressures were      borderline low, was tachycardic.  He was admitted to the hospital      for further evaluation and treatment.  Patient was initially put on      Rocephin, Zithromax and Clindamycin.  This was changed over to      [place].  Patient subsequently improved.  He became      afebrile, started tolerating p.o. intake well.  His blood pressures      started improving and went up to hypertensive range, at which point      his anti-hypertensive were initiated.  Urine cultures grew E. Coli      more than 100,000 colonies.  They were actually resistant to      Levaquin, sensitive to ampicillin, and his Levaquin was      discontinued.  One out of four blood also grew E. Coli, which was      also sensitive to ampicillin, resistant to fluoroquinolones.      Actually, the blood E. Coli  shows intermittent sensitivity to      ampicillin.  All of them are sensitive to Ceftriaxone.  Patient has      done really well the past few days.  He has been passing urine with      no difficulties.  He has been afebrile, hence I think he is stable      to go home at this point.  2. Acute renal failure.  It is also noted that his creatinine was 2.6,      went up to 3.2, but now it has been trending down, and today it is      2.5.  BUN is normal.  He has been eating and peeing well.  I think      this will continue to show improvement, even at home.  3. Diarrhea.  Patient was having about 3  to 4 loose stools every day.      C-diff was negative.  Patient has history of Crohn's disease and      has undergone a colectomy in the past, and as a result has chronic      diarrhea for which he is on Colestipol.  I think his diarrhea is      possibly chronic.  I have told him to take Imodium as need for his      diarrhea.  4. Hypertension as mentioned above.  Initially blood pressure      medications were held, which were restarted as he went back into      hypertensive range.   On the day of discharge, patient was feeling quite well.  He was  afebrile, and he was considered stable for discharge.  His wife, the  primary caregiver, also felt comfortable taking him home.  He will need  to have EMS transport him home, because of his quadriplegia.   I will ask the patient's wife to re-schedule appointment with Dr. Michela Pitcher  for his bladder issues, because of his quadriparesis.   DISCHARGE MEDICATIONS:  1. Vantin 200 mg p.o. once daily for 7 days.  This is adjusted for      renal function.  He has been told to take Imodium 2 mg every 6      hours as needed for diarrhea.  He has been told to take Tylenol as      needed for fever.   He has been asked to resume all of his medications as before, except for  the lisinopril, which he may consider resuming after speaking with his  doctor.  We are  holding this because of his renal function.   MEDICATIONS:  His home medications included:  1. Mirtazapine 30 mg q.h.s.  2. Lisinopril 5 mg p.o. daily.  3. Colestipol 1 gram p.r.n. for diarrhea.  4. Rena-Vite 1 tablet daily.  5. Catapres patch 0.01 q.7 days.  6. Baclofen 20 mg t.i.d. for spasms.   DIET:  He may have a low-salt diet.   PHYSICAL ACTIVITY:  Patient requires a lot of assistance at home.  He is  pretty much wheelchair bound.  Sometimes he is able to walk.  We will  set up home PT for him, but he may resume his previous level of  activity.   FOLLOWUP:  With Dr. Michela Pitcher in one week, with his P. M.D. , Dr. Marcelline Mates,  in one week.   Home health has been set for this individual starting Thursday.   Imaging studies done include a chest x-ray which showed low lung volumes  with no other primary findings.  Again, nothing remarkable there.   No consultations were obtained during this admission.   Total time of discharge was 40 minutes.      Bonnielee Haff, MD  Electronically Signed     GK/MEDQ  D:  07/05/2006  T:  07/05/2006  Job:  [id number]   cc:   Roslynn Amble, M.D.   Marissa Nestle, M.D.  Fax: [phone]

## 2010-11-27 NOTE — Group Therapy Note (Signed)
NAME:  Christopher Mcdonald, Christopher Mcdonald                         ACCOUNT NO.:  [id number]   MEDICAL RECORD NO.:  [id number]                   PATIENT TYPE:  INP   LOCATION:  A215                                 FACILITY:  [facility]   PHYSICIAN:  Edward L. Luan Pulling, M.D.             DATE OF BIRTH:  [birth year]   DATE OF PROCEDURE:  DATE OF DISCHARGE:                                   PROGRESS NOTE   PROBLEM:  1. Pneumonia.  2. Quadriparesis.   SUBJECTIVE:  Christopher Mcdonald says he does not feel well, but has nothing specific.  He is still coughing up some blood but not a great deal.   OBJECTIVE:  His exam now shows that his chest is actually fairly clear with  some rhonchi on the left.  His heart is regular without gallop.  Temperature  is 96.8.  Pulse 112.  Respirations 20.  Blood pressure is up at 194/121.  O2  saturations is 100% on four liters.   ASSESSMENT:  Chest wise, he sounds better.  I think he is slowly clearing  this pneumonia.   PLAN:  Continue with his current treatments.  He will probably need another  x-ray in a day or two.  If over time he does not totally clear his effusion,  he is going to need a thoracentesis, but trying to avoid invasive procedures  considering his overall fairly poor status.      ___________________________________________                                            Christopher Mcdonald Luan Pulling, M.D.   ELH/MEDQ  D:  01/01/2004  T:  01/01/2004  Job:  [id number]

## 2010-11-27 NOTE — Group Therapy Note (Signed)
NAME:  Christopher Mcdonald, Christopher Mcdonald                         ACCOUNT NO.:  [id number]   MEDICAL RECORD NO.:  [id number]                   PATIENT TYPE:  INP   LOCATION:  A215                                 FACILITY:  [facility]   PHYSICIAN:  Edward L. Luan Pulling, M.D.             DATE OF BIRTH:  [birth year]   DATE OF PROCEDURE:  DATE OF DISCHARGE:                                   PROGRESS NOTE   PROBLEM:  1. Pneumonia.  2. Quadriparesis.  3. Chronic depression.   SUBJECTIVE:  Christopher Mcdonald came out last night and has been out through the  day.   OBJECTIVE:  His temperature is 96.8, pulse 102, respirations 20, blood  pressure 174/114.  O2 saturations 95% on four liters.  His chest shows  rhonchi more bilaterally now that before.  His white count is 7800,  hemoglobin 11.2.  His chest x-ray shows little change.   ASSESSMENT/PLAN:  Plan to continue treatments.  I think some of the problem  may be that he has a pleural effusion on the left, and that is layering  out.      ___________________________________________                                            Jasper Loser. Luan Pulling, M.D.   ELH/MEDQ  D:  12/29/2003  T:  12/29/2003  Job:  [id number]

## 2010-11-27 NOTE — H&P (Signed)
NAME:  Christopher Mcdonald, Christopher Mcdonald                         ACCOUNT NO.:  [id number]   MEDICAL RECORD NO.:  [id number]                   PATIENT TYPE:  INP   LOCATION:  A206                                 FACILITY:  [facility]   PHYSICIAN:  Barrie Folk. Berdine Addison, M.D.                DATE OF BIRTH:  03/28/1923   DATE OF ADMISSION:  12/25/2003  DATE OF DISCHARGE:                                HISTORY & PHYSICAL   IDENTIFYING DATA:  The patient is a 77 year old married, disabled, black  male for [place], [place].   CHIEF COMPLAINT:  Productive cough.   HISTORY OF PRESENT ILLNESS:  The patient has been experiencing a cough x 3  weeks.  The cough has been productive x 8 to 10 days.  Sputum is described  as thick and white initially.  Apparently the sputum contains blood  according to wife.  History is also positive for sweaty spells, temperature  at home greater than 101 degrees F, chills, decreased appetite, sore throat,  running nose, generalized malaise, occasional wheezing, and feeling short of  breath.  History is negative for vomiting and diarrhea.  The patient was  seen in the office on December 23, 2003, for these symptoms.  He was treated  with Rocephin 1 g IM, p.o. Biaxin 500 mg 2 tablets p.o. every day,  Robitussin AC syrup for cough, and Tylenol for temperature.  White count in  the office was 12,200.  However, due to persistent symptoms and generalized  malaise, the wife was advised by me to take the patient to the emergency  room on the day of admission for further evaluation and radiological  studies.   PAST MEDICAL HISTORY:  1. Chronic renal insufficiency.  2. Crohn's disease.  3. Status post cervical disk fusion February 28, 2002, secondary to spinal     cord injury.  4. Hypotonic neurogenic bladder.  5. Chronic depression.   Medical history is negative for diabetes, tuberculosis, cancer, sickle cell,  asthma, seizure disorder.   ALLERGIES:  The patient is not allergic to any  medications.   PRESCRIBED MEDICATIONS:  1. Nephro-Vite 1 tablet p.o. every day.  2. Baclofen 20 mg p.o. t.i.d.  3. Remeron 30 mg p.o. every bedtime.  4. Pepcid 20 mg p.o. q.12h.   HABITS:  Positive for former use of ethanol and tobacco.   FAMILY HISTORY:  Mother deceased at age 30 secondary to complications of  stroke;  father deceased at age 32 secondary to death via gas in World War  I.  Three brothers living.  One age 60, history of AIDS; age 62, health  unknown; age 28, history of hypertension.  Two daughters living.  One age 7  is in good health, age 3 in good health.  One son is living at age 40 in  good health.   HOSPITALIZATIONS:  1. Transurethral prostatectomy in 1980, [facility].  2. Cecal resection for  inflammatory mass in June 1998.  3. Peritonitis secondary to anastomotic leak in December 1998, treated with     a colostomy.  4. Re-anastomosis April 07, 1998, [facility], [place], [place].   REVIEW OF SYSTEMS:  Positive for patient requiring total care.  The patient  is paralyzed below the neck.  Review of Systems is negative for ulceration  on bottom, swelling of legs, gross hematuria, chronic headache, epistaxis,  bleeding gum, hematemesis, melena, etc.  Review of Systems is positive for  spasm of right leg, mood of melancholy, variable appetite, etc.   PHYSICAL EXAMINATION:  GENERAL:  Medium-frame, medium-height black male in  no apparent respiratory distress.  HEENT:  Head positive male pattern baldness.  Ears:  Normal auricles.  Eye:  Lids negative for ptosis, sclerae white, pupils round, equal, and reactive  to light.  Extraocular movements intact.  Nose: Negative discharge.  Mouth:  Missing teeth.  Remaining dentition fair.  Posterior pharynx benign.  NECK:  Positive old healed surgical scar involving posterior aspect.  No  cervical adenopathy.  LUNGS:  Clear to auscultation.  Fair air movement.  Positive for tenderness  on palpation left  lower lung field.  HEART:  Audible S1 and S2 without murmur.  Regular rhythm.  Rate within  normal.  ABDOMEN:  Some distention.  Positive healed old mid gastric surgical scar.  Soft, nontender in all four quadrants.  No palpable masses, no organomegaly.  GENITALIA:  Penis uncircumcised.  No penile lesion or discharge.  Scrotum  palpable testicles without nodule or tenderness.  RECTAL:  Deferred.  EXTREMITIES:  Positive contractures of fingers of both hands.  Right hand  can move laterally mildly.  Left hand cannot move laterally or lift up.  Lower extremities: No movement.  NEUROLOGIC:  Alert and oriented x 3.  Communicates verbally.  Sensation:  Tactile, proprioception, pain intact.   LABORATORY AND X-RAY DATA:  Positive left lower infiltrate versus  atelectasis on December 23, 2003.   White count 15,300, hemoglobin 14.2, hematocrit 42.0, platelets 157,000.  Sodium 133, potassium 4.3, chloride 103, CO2 22, glucose 121, BUN 22,  creatinine 2.9.   IMPRESSION:  Primary acute left pneumonia.   SECONDARY DIAGNOSES:  1. Chronic renal insufficiency.  2. Crohn's disease.  3. Quadriplegia secondary to spinal cord injury.  4. Hypertonic neurogenic bladder.  5. Chronic depression.   PLAN:  1. Admit to telemetry.  2. Blood cultures.  3. IV antibiotics.  4. Oxygen at 4 liters per minute via nasal cannula.  5. Xopenex 1.25 mg/ml every 6 hours.  6. Endal HD cough syrup 2 teaspoons p.o. q.4h.  7. Tylenol 650 mg p.o. q.4-6h. p.r.n. for temperature 101 or greater.  8. Head of bed always greater than 35 degrees.  9. Guaifenesin 600 mg 2 tablets p.o. b.i.d.   PLAN:  1. Diet:  Dinner soft.  Assist with feeding at each meal.  2. Passive range of motion daily by physical therapy.  3. Out of bed every day.  4. O2 saturation q.6h. x 72 hours.  5. Baclofen 20 mg p.o. t.i.d.  6. Remeron 30 mg p.o. every bedtime.  7. Pepcid 20 mg p.o. q.12h. 8. Nephro-Vite 1 p.o. every day.  9. Incentive  spirometer, use every 2 hours from 0700 to 2100 supervised by     nurse.  10.      Check Foley.  11.      Moisture barrier to buttock during every diaper change.  12.  Case worker consult.     ___________________________________________                                         Barrie Folk. Berdine Addison, M.D.   Armanda Heritage  D:  12/25/2003  T:  12/25/2003  Job:  [id number]
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
## 2010-11-27 NOTE — Group Therapy Note (Signed)
NAME:  Christopher Mcdonald, DETTLING                         ACCOUNT NO.:  [id number]   MEDICAL RECORD NO.:  [id number]                   PATIENT TYPE:  INP   LOCATION:  A215                                 FACILITY:  [facility]   PHYSICIAN:  Edward L. Luan Pulling, M.D.             DATE OF BIRTH:  1922/10/03   DATE OF PROCEDURE:  DATE OF DISCHARGE:                                   PROGRESS NOTE   Mr. Enciso did have his lateral decubitus chest x-ray yesterday and it does  show that he does have some free flowing fluid.  I think that is the actual  etiology of what we are seeing on his chest x-ray, not that his pneumonia is  any worse.  Overall, I think that he is improving slowly and we plan to  continue with his treatments.      ___________________________________________                                            Jasper Loser. Luan Pulling, M.D.   ELH/MEDQ  D:  12/31/2003  T:  12/31/2003  Job:  [id number]
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
## 2010-11-27 NOTE — Group Therapy Note (Signed)
Christopher Mcdonald, Christopher Mcdonald               ACCOUNT NO.:  [id number]   MEDICAL RECORD NO.:  [id number]          PATIENT TYPE:  INP   LOCATION:  A211                          FACILITY:  [facility]   PHYSICIAN:  Audria Nine, M.D.DATE OF BIRTH:  09/30/1922   DATE OF PROCEDURE:  DATE OF DISCHARGE:                                 PROGRESS NOTE   SUBJECTIVE:  The patient feels slightly better today.  He states his  strength is a little bit back.  He has had no fevers or chills.  He  still is a little bit weak though.  He denies any cough.  No vomiting or  abdominal pain.  His appetite is yet to return.  He denies any back  ache.   OBJECTIVE:  Conscious, alert, comfortable, not in acute distress.  The  patient's wife was present in the room and I discussed current plans.  VITAL SIGNS:  Blood pressure 154/82, pulse of 98, respirations 20, T-max  was 99.7 ranging to 100.5.  Oxygen saturation was 96% on 2L.  HEENT:  Normocephalic, atraumatic.  Oral mucosa was moist.  NECK:  Supple.  No JVD.  LUNGS:  Clear equally, good air entry bilaterally.  HEART:  S1, S2 regular.  No S3, S4 gallops or rubs.  ABDOMEN:  Soft and nontender.  Bowel sounds positive.  EXTREMITIES:  No edema.  CNS:  The patient has spastic quadriparesis.   LABORATORY/DIAGNOSTIC DATA:  Blood cultures 1 out of 2 is growing Gram-  negative rods.  Awaiting final identification.  Hemoglobin is 12.8,  hematocrit 38.5,  white blood cell count 7.0, which is improved.  Platelet count is 133 with no left shift.   Sodium 142, potassium 4.1, chloride 115, CO2 21, glucose 94, BUN 20,  creatinine 2.9.  Calcium was 8.1.  Hepatic enzymes were negative.   ASSESSMENT AND PLAN:  1. Urosepsis with Gram-negative rods in the blood, awaiting final      identification.  I switched his antibiotics yesterday to Zosyn and      Levaquin to provide blood coverage for Gram-negative rods.   I will make changes as needed once I receive full identification,  full  sensitivity results.  1. Spastic quadriparesis.  The patient is stable.  It appears the      patient is in good control his bladder, but it remains unclear how      much urine he is retaining in between voiding episodes.  The      patient is followed by Dr. Marcell Anger and he will clearly need a followup      appointment subsequent to this hospitalization.   DISPOSITION:  Urosepsis from UTI, Gram-negative rods in blood.  The  patient doing very well.  Hemodynamically stable.  I would continue  current therapy.  He does have some diarrhea.  Will send stools for  clostridium difficile, after which we may restart his colestipol, which  the patient was on at home, and it helps him with the diarrhea.  The  patient will need followup with Dr. Michela Pitcher after discharge to address  his bladder functions.  It  appears his diarrhea is from chronic Crohn's  disease, for which he has had surgery for in the past.      Audria Nine, M.D.  Electronically Signed     AM/MEDQ  D:  07/03/2006  T:  07/03/2006  Job:  [id number]
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
## 2010-11-27 NOTE — Group Therapy Note (Signed)
NAME:  LYNKEN, MECKLEY                         ACCOUNT NO.:  [id number]   MEDICAL RECORD NO.:  [id number]                   PATIENT TYPE:  INP   LOCATION:  A215                                 FACILITY:  [facility]   PHYSICIAN:  Edward L. Luan Pulling, M.D.             DATE OF BIRTH:  05/08/1923   DATE OF PROCEDURE:  DATE OF DISCHARGE:                                   PROGRESS NOTE   PROBLEM:  Pneumonia.   SUBJECTIVE:  Mr. Olivio seems to be doing better.  He has less congestion.  He  is more alert.   PHYSICAL EXAMINATION:  VITAL SIGNS:  His temperature is 97.1; pulse 102;  respirations 20; blood pressure is better at 164/96; O2 saturation is 99% on  two liters.   Chest x-ray yesterday looked much improved.   ASSESSMENT:  He is improving.   PLAN:  Continue with his treatments and medications.      ___________________________________________                                            Jasper Loser Luan Pulling, M.D.   ELH/MEDQ  D:  01/02/2004  T:  01/02/2004  Job:  [id number]
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
## 2010-11-27 NOTE — Consult Note (Signed)
NAME:  Christopher Mcdonald, Christopher Mcdonald                         ACCOUNT NO.:  [id number]   MEDICAL RECORD NO.:  [id number]                   PATIENT TYPE:  INP   LOCATION:  A206                                 FACILITY:  [facility]   PHYSICIAN:  Edward L. Luan Pulling, M.D.             DATE OF BIRTH:  10/28/1922   DATE OF CONSULTATION:  12/26/2003  DATE OF DISCHARGE:                                   CONSULTATION   REFERRING PHYSICIAN:  Barrie Folk. Berdine Addison, M.D.   REASON FOR CONSULTATION:  Pneumonia and hemoptysis.   HISTORY OF PRESENT ILLNESS:  Christopher Mcdonald is a 77 year old who lives at home  with his wife.  He has had a cough for about three weeks.  He has been  coughing up some sputum for several days and he has had some fever for the  last three days or so.  He has had some sweaty episodes, but no frank  chills, temperature that was higher than 101 and apparently just not feeling  well.  He was seen by Dr. Berdine Addison in his office on June 13, for these symptoms  and was given Rocephin, Biaxin and Robitussin, but continued having  increasing problems and came to the emergency room for evaluation.   PAST MEDICAL HISTORY:  1. Chronic renal insufficiency.  2. Crohn's disease.  3. Disc fusion secondary to spinal cord injury.  4. Neurogenic bladder.  5. Depression.  6. Quadriparesis because of spinal cord injury.   MEDICATIONS:  At home, he has been on Nephro-Vite, Baclofen, Remeron and  Pepcid.   SOCIAL HISTORY:  He lives at home with his wife who provides his care.  He  has past history of tobacco and ethanol use, but has not used any in several  years.   FAMILY HISTORY:  His mother died in her 35's of stroke.  Father died from  complications of gas exposure during [place].   REVIEW OF SYMPTOMS:  His review of systems, except as mentioned, is  essentially negative.   PHYSICAL EXAMINATION:  GENERAL:  Well-developed male who does not appear to  be in any acute distress.  He has several missing teeth.  According to  the  nursing staff, he had some problems swallowing.  I did not observe him  swallow now.  NECK:  Previous surgical scar.  CHEST:  Fairly clear with decreased breath sounds bilaterally, but no  rhonchi.  HEART:  Regular without gallop.  ABDOMEN:  Soft, no masses are felt.  Bowel sounds are active.  EXTREMITIES:  He has contractures.  He has quadriparesis.  NEUROLOGIC:  He is alert and oriented.   LABORATORY DATA AND X-RAY FINDINGS:  His chest x-ray from yesterday shows  left lower lobe infiltrate which looks worse than the one on June 13.   His BUN is 22, creatinine 2.9, sodium 133.  CBC shows his white count is  10,900, hemoglobin 12.6.  Liver function shows his  bilirubin is up slightly.  Albumin is 2.8.  Blood culture is pending.   ASSESSMENT:  He has what appears to be a left lower lobe pneumonia.  Obviously, he is going to have a harder time clearing this because of his  quadriparesis.   RECOMMENDATIONS:  I would plan to continue with his antibiotics and see if  we can get him on some sort of mucolytic agent.  He actually has a pretty  good cough effort, so I do not think he needs bronchoscopy to try and clear  anything.  I encourage him to continue coughing, deep breathing, etc.  I am  going to go ahead and ask for a swallowing study since he apparently had  severe difficulty swallowing last night.   Thank you for allowing me to see him with you.      ___________________________________________                                            Jasper Loser. Luan Pulling, M.D.   ELH/MEDQ  D:  12/26/2003  T:  12/26/2003  Job:  [id number]

## 2010-11-27 NOTE — Group Therapy Note (Signed)
NAME:  Christopher Mcdonald, Christopher Mcdonald                         ACCOUNT NO.:  [id number]   MEDICAL RECORD NO.:  [id number]                   PATIENT TYPE:  INP   LOCATION:  A215                                 FACILITY:  [facility]   PHYSICIAN:  Edward L. Luan Pulling, M.D.             DATE OF BIRTH:  06/13/1923   DATE OF PROCEDURE:  DATE OF DISCHARGE:                                   PROGRESS NOTE   SUBJECTIVE:  Christopher Mcdonald is a bit confused this morning.  He says he is doing  better as far as his chest is concerned.  He is still coughing. He is  coughing up some sputum with some blood in it.   OBJECTIVE:  His exam shows his temperature is 99.2.  Pulse 122, respirations  20, blood pressure 152/102.  O2 saturations 99% on four liters.  His chest  is actually fairly clear.  He is still coughing.  He still has some rhonchi  when he coughs.   ASSESSMENT:  I think he has got probably some hospital-acquired confusion.   PLAN:  My plan at this point would be to continue with his medicines as  before, and reorient him which was done, encourage him to cough, and produce  sputum.      ___________________________________________                                            Jasper Loser Luan Pulling, M.D.   ELH/MEDQ  D:  12/28/2003  T:  12/28/2003  Job:  [id number]
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
## 2011-03-10 ENCOUNTER — Other Ambulatory Visit: Payer: Self-pay | Admitting: Neurology

## 2011-03-10 DIAGNOSIS — R29898 Other symptoms and signs involving the musculoskeletal system: Secondary | ICD-10-CM

## 2011-03-19 ENCOUNTER — Ambulatory Visit (HOSPITAL_COMMUNITY)
Admission: RE | Admit: 2011-03-19 | Discharge: 2011-03-19 | Disposition: A | Discharge status: Home / Self Care | Payer: Medicare Other | Source: Ambulatory Visit | Attending: Neurology | Admitting: Neurology

## 2011-03-19 ENCOUNTER — Other Ambulatory Visit: Payer: Self-pay | Admitting: Neurology

## 2011-03-19 DIAGNOSIS — R29898 Other symptoms and signs involving the musculoskeletal system: Secondary | ICD-10-CM

## 2011-03-19 DIAGNOSIS — M25559 Pain in unspecified hip: Secondary | ICD-10-CM | POA: Insufficient documentation

## 2011-03-19 DIAGNOSIS — M6281 Muscle weakness (generalized): Secondary | ICD-10-CM | POA: Insufficient documentation

## 2011-03-19 DIAGNOSIS — M1610 Unilateral primary osteoarthritis, unspecified hip: Secondary | ICD-10-CM | POA: Insufficient documentation

## 2011-03-19 DIAGNOSIS — R209 Unspecified disturbances of skin sensation: Secondary | ICD-10-CM | POA: Insufficient documentation

## 2011-03-19 DIAGNOSIS — G319 Degenerative disease of nervous system, unspecified: Secondary | ICD-10-CM | POA: Insufficient documentation

## 2011-03-19 DIAGNOSIS — M169 Osteoarthritis of hip, unspecified: Secondary | ICD-10-CM | POA: Insufficient documentation

## 2011-04-20 LAB — BLOOD GAS, ARTERIAL
FIO2: 0.21
O2 Saturation: 97
Patient temperature: 37
pH, Arterial: 7.386

## 2011-06-23 ENCOUNTER — Ambulatory Visit (HOSPITAL_COMMUNITY)
Admission: RE | Admit: 2011-06-23 | Discharge: 2011-06-23 | Disposition: A | Discharge status: Home / Self Care | Payer: Medicare Other | Source: Ambulatory Visit | Attending: Family Medicine | Admitting: Family Medicine

## 2011-06-23 DIAGNOSIS — R55 Syncope and collapse: Secondary | ICD-10-CM

## 2011-06-23 NOTE — Progress Notes (Signed)
*  PRELIMINARY RESULTS* Echocardiogram 48H Holter monitor has been performed.  Tera Partridge 06/23/2011, 2:27 PM

## 2011-06-30 NOTE — Procedures (Signed)
NAMEMarland Kitchen  HAORAN, STARWALT NO.:  [id number]  MEDICAL RECORD NO.:  [id number]  LOCATION:  CARDIOPU                      FACILITY:  [facility]  PHYSICIAN:  Cristopher Estimable. Lattie Haw, MD, FACCDATE OF BIRTH:  Sep 26, 1922  DATE OF PROCEDURE: DATE OF DISCHARGE:  06/24/2011                               HOLTER MONITOR   REFERRING PHYSICIAN:  Barrie Folk. Hill, MD  CLINICAL DATA:  An 77 year old gentleman with near syncope.  1. Continuous electrocardiographic recording was maintained for 48     hours, during which, the predominant rhythm was normal sinus with     sinus arrhythmia and borderline first-degree AV block.  There was     modest sinus tachycardia to a peak rate of 125 bpm. 2. Rare PVCs and PACs each occurred at an average rate of fewer than 1 per hour. 3. No significant arrhythmias were identified. 4. No significant ST-segment elevation or depression was seen. 5. A detailed diary of activity was returned reporting 4 episodes of     dizziness, 2 of nausea, and 1 of dyspnea.  In all cases, the rhythm     was normal sinus.     Cristopher Estimable. Lattie Haw, MD, [facility]     RMR/MEDQ  D:  06/29/2011  T:  06/30/2011  Job:  [id number]

## 2012-03-27 ENCOUNTER — Ambulatory Visit: Payer: Medicare Other | Admitting: Family Medicine

## 2014-10-24 ENCOUNTER — Emergency Department (HOSPITAL_COMMUNITY): Payer: Medicare PPO

## 2014-10-24 ENCOUNTER — Encounter (HOSPITAL_COMMUNITY): Payer: Self-pay | Admitting: *Deleted

## 2014-10-24 ENCOUNTER — Inpatient Hospital Stay (HOSPITAL_COMMUNITY)
Admission: EM | Admit: 2014-10-24 | Discharge: 2014-10-28 | DRG: 193 | Disposition: A | Discharge status: Home / Self Care | Payer: Medicare PPO | Attending: Internal Medicine | Admitting: Internal Medicine

## 2014-10-24 DIAGNOSIS — Z888 Allergy status to other drugs, medicaments and biological substances status: Secondary | ICD-10-CM

## 2014-10-24 DIAGNOSIS — Z993 Dependence on wheelchair: Secondary | ICD-10-CM | POA: Diagnosis not present

## 2014-10-24 DIAGNOSIS — Z87891 Personal history of nicotine dependence: Secondary | ICD-10-CM

## 2014-10-24 DIAGNOSIS — Z882 Allergy status to sulfonamides status: Secondary | ICD-10-CM | POA: Diagnosis not present

## 2014-10-24 DIAGNOSIS — F0390 Unspecified dementia without behavioral disturbance: Secondary | ICD-10-CM | POA: Diagnosis present

## 2014-10-24 DIAGNOSIS — G8929 Other chronic pain: Secondary | ICD-10-CM | POA: Diagnosis present

## 2014-10-24 DIAGNOSIS — N189 Chronic kidney disease, unspecified: Secondary | ICD-10-CM | POA: Diagnosis present

## 2014-10-24 DIAGNOSIS — A419 Sepsis, unspecified organism: Secondary | ICD-10-CM | POA: Diagnosis not present

## 2014-10-24 DIAGNOSIS — N179 Acute kidney failure, unspecified: Secondary | ICD-10-CM | POA: Diagnosis present

## 2014-10-24 DIAGNOSIS — J1100 Influenza due to unidentified influenza virus with unspecified type of pneumonia: Secondary | ICD-10-CM | POA: Diagnosis present

## 2014-10-24 DIAGNOSIS — I129 Hypertensive chronic kidney disease with stage 1 through stage 4 chronic kidney disease, or unspecified chronic kidney disease: Secondary | ICD-10-CM | POA: Diagnosis present

## 2014-10-24 DIAGNOSIS — R05 Cough: Secondary | ICD-10-CM | POA: Diagnosis not present

## 2014-10-24 DIAGNOSIS — Z87898 Personal history of other specified conditions: Secondary | ICD-10-CM

## 2014-10-24 DIAGNOSIS — N390 Urinary tract infection, site not specified: Secondary | ICD-10-CM | POA: Diagnosis present

## 2014-10-24 DIAGNOSIS — E872 Acidosis: Secondary | ICD-10-CM

## 2014-10-24 DIAGNOSIS — R7989 Other specified abnormal findings of blood chemistry: Secondary | ICD-10-CM | POA: Insufficient documentation

## 2014-10-24 DIAGNOSIS — R509 Fever, unspecified: Secondary | ICD-10-CM | POA: Diagnosis not present

## 2014-10-24 DIAGNOSIS — R059 Cough, unspecified: Secondary | ICD-10-CM | POA: Diagnosis present

## 2014-10-24 DIAGNOSIS — N319 Neuromuscular dysfunction of bladder, unspecified: Secondary | ICD-10-CM | POA: Diagnosis present

## 2014-10-24 DIAGNOSIS — B952 Enterococcus as the cause of diseases classified elsewhere: Secondary | ICD-10-CM | POA: Diagnosis present

## 2014-10-24 DIAGNOSIS — R319 Hematuria, unspecified: Secondary | ICD-10-CM

## 2014-10-24 DIAGNOSIS — J189 Pneumonia, unspecified organism: Secondary | ICD-10-CM

## 2014-10-24 DIAGNOSIS — G8250 Quadriplegia, unspecified: Secondary | ICD-10-CM | POA: Diagnosis present

## 2014-10-24 HISTORY — DX: Unspecified injury at unspecified level of cervical spinal cord, initial encounter: S14.109A

## 2014-10-24 HISTORY — DX: Essential (primary) hypertension: I10

## 2014-10-24 LAB — CBC WITH DIFFERENTIAL/PLATELET
Basophils Absolute: 0 10*3/uL (ref 0.0–0.1)
Basophils Relative: 0 % (ref 0–1)
Eosinophils Absolute: 0 10*3/uL (ref 0.0–0.7)
Eosinophils Relative: 0 % (ref 0–5)
HEMATOCRIT: 46.1 % (ref 39.0–52.0)
HEMOGLOBIN: 15.5 g/dL (ref 13.0–17.0)
LYMPHS ABS: 0.9 10*3/uL (ref 0.7–4.0)
Lymphocytes Relative: 16 % (ref 12–46)
MCH: 30.9 pg (ref 26.0–34.0)
MCHC: 33.6 g/dL (ref 30.0–36.0)
MCV: 92 fL (ref 78.0–100.0)
MONO ABS: 1.1 10*3/uL — AB (ref 0.1–1.0)
Monocytes Relative: 18 % — ABNORMAL HIGH (ref 3–12)
NEUTROS PCT: 66 % (ref 43–77)
Neutro Abs: 3.8 10*3/uL (ref 1.7–7.7)
Platelets: 125 10*3/uL — ABNORMAL LOW (ref 150–400)
RBC: 5.01 MIL/uL (ref 4.22–5.81)
RDW: 13.7 % (ref 11.5–15.5)
WBC: 5.8 10*3/uL (ref 4.0–10.5)

## 2014-10-24 LAB — LACTIC ACID, PLASMA
LACTIC ACID, VENOUS: 2.3 mmol/L — AB (ref 0.5–2.0)
Lactic Acid, Venous: 3.2 mmol/L (ref 0.5–2.0)

## 2014-10-24 LAB — URINE MICROSCOPIC-ADD ON

## 2014-10-24 LAB — COMPREHENSIVE METABOLIC PANEL
ALK PHOS: 88 U/L (ref 39–117)
ALT: 15 U/L (ref 0–53)
AST: 30 U/L (ref 0–37)
Albumin: 4 g/dL (ref 3.5–5.2)
Anion gap: 9 (ref 5–15)
BUN: 17 mg/dL (ref 6–23)
CHLORIDE: 99 mmol/L (ref 96–112)
CO2: 27 mmol/L (ref 19–32)
Calcium: 8.8 mg/dL (ref 8.4–10.5)
Creatinine, Ser: 2.01 mg/dL — ABNORMAL HIGH (ref 0.50–1.35)
GFR, EST AFRICAN AMERICAN: 32 mL/min — AB (ref >90)
GFR, EST NON AFRICAN AMERICAN: 28 mL/min — AB (ref >90)
GLUCOSE: 99 mg/dL (ref 70–99)
POTASSIUM: 4.8 mmol/L (ref 3.5–5.1)
Sodium: 135 mmol/L (ref 135–145)
Total Bilirubin: 1.4 mg/dL — ABNORMAL HIGH (ref 0.3–1.2)
Total Protein: 7.7 g/dL (ref 6.0–8.3)

## 2014-10-24 LAB — URINALYSIS, ROUTINE W REFLEX MICROSCOPIC
Bilirubin Urine: NEGATIVE
Glucose, UA: NEGATIVE mg/dL
Ketones, ur: NEGATIVE mg/dL
Nitrite: NEGATIVE
PH: 5.5 (ref 5.0–8.0)
SPECIFIC GRAVITY, URINE: 1.025 (ref 1.005–1.030)
Urobilinogen, UA: 0.2 mg/dL (ref 0.0–1.0)

## 2014-10-24 LAB — INFLUENZA PANEL BY PCR (TYPE A & B)
H1N1FLUPCR: DETECTED — AB
INFLAPCR: POSITIVE — AB
INFLBPCR: NEGATIVE

## 2014-10-24 MED ORDER — SODIUM CHLORIDE 0.9 % IV BOLUS (SEPSIS)
1000.0000 mL | Freq: Once | INTRAVENOUS | Status: AC
  Administered 2014-10-24: 1000 mL via INTRAVENOUS

## 2014-10-24 MED ORDER — GUAIFENESIN-DM 100-10 MG/5ML PO SYRP: 5.0000 mL | ORAL_SOLUTION | ORAL | Status: DC | PRN

## 2014-10-24 MED ORDER — DEXTROSE 5 % IV SOLN
1.0000 g | Freq: Once | INTRAVENOUS | Status: AC
  Administered 2014-10-24: 1 g via INTRAVENOUS
  Filled 2014-10-24: qty 10

## 2014-10-24 MED ORDER — VANCOMYCIN HCL IN DEXTROSE 1-5 GM/200ML-% IV SOLN
1000.0000 mg | Freq: Once | INTRAVENOUS | Status: AC
  Administered 2014-10-25: 1000 mg via INTRAVENOUS
  Filled 2014-10-24: qty 200

## 2014-10-24 MED ORDER — IPRATROPIUM BROMIDE 0.02 % IN SOLN
0.5000 mg | Freq: Four times a day (QID) | RESPIRATORY_TRACT | Status: DC
  Administered 2014-10-25: 0.5 mg via RESPIRATORY_TRACT
  Filled 2014-10-24: qty 2.5

## 2014-10-24 MED ORDER — PIPERACILLIN-TAZOBACTAM 3.375 G IVPB 30 MIN
3.3750 g | Freq: Once | INTRAVENOUS | Status: AC
  Administered 2014-10-25: 3.375 g via INTRAVENOUS
  Filled 2014-10-24: qty 50

## 2014-10-24 MED ORDER — ONDANSETRON HCL 4 MG PO TABS: 4.0000 mg | ORAL_TABLET | Freq: Four times a day (QID) | ORAL | Status: DC | PRN

## 2014-10-24 MED ORDER — SODIUM CHLORIDE 0.9 % IV BOLUS (SEPSIS)
500.0000 mL | Freq: Once | INTRAVENOUS | Status: AC
  Administered 2014-10-24: 500 mL via INTRAVENOUS

## 2014-10-24 MED ORDER — ALBUTEROL SULFATE (2.5 MG/3ML) 0.083% IN NEBU: 2.5000 mg | INHALATION_SOLUTION | RESPIRATORY_TRACT | Status: DC | PRN

## 2014-10-24 MED ORDER — ONDANSETRON HCL 4 MG/2ML IJ SOLN: 4.0000 mg | Freq: Four times a day (QID) | INTRAMUSCULAR | Status: DC | PRN

## 2014-10-24 MED ORDER — ENOXAPARIN SODIUM 30 MG/0.3ML ~~LOC~~ SOLN
30.0000 mg | SUBCUTANEOUS | Status: DC
  Filled 2014-10-24 (×3): qty 0.3

## 2014-10-24 MED ORDER — IPRATROPIUM-ALBUTEROL 0.5-2.5 (3) MG/3ML IN SOLN
3.0000 mL | Freq: Once | RESPIRATORY_TRACT | Status: AC
Start: 2014-10-24 — End: 2014-10-24
  Administered 2014-10-24: 3 mL via RESPIRATORY_TRACT
  Filled 2014-10-24: qty 3

## 2014-10-24 MED ORDER — BACLOFEN 10 MG PO TABS
5.0000 mg | ORAL_TABLET | Freq: Two times a day (BID) | ORAL | Status: DC
  Administered 2014-10-25 – 2014-10-28 (×8): 5 mg via ORAL
  Filled 2014-10-24 (×8): qty 1

## 2014-10-24 MED ORDER — ACETAMINOPHEN 325 MG PO TABS
650.0000 mg | ORAL_TABLET | Freq: Once | ORAL | Status: AC
  Administered 2014-10-24: 650 mg via ORAL
  Filled 2014-10-24: qty 2

## 2014-10-24 MED ORDER — SODIUM CHLORIDE 0.9 % IJ SOLN
3.0000 mL | Freq: Two times a day (BID) | INTRAMUSCULAR | Status: DC
Start: 2014-10-24 — End: 2014-10-28
  Administered 2014-10-26: 3 mL via INTRAVENOUS

## 2014-10-24 MED ORDER — SODIUM CHLORIDE 0.9 % IV SOLN
INTRAVENOUS | Status: DC
  Administered 2014-10-25: 01:00:00 via INTRAVENOUS

## 2014-10-24 MED ORDER — SODIUM CHLORIDE 0.9 % IV SOLN
INTRAVENOUS | Status: DC
  Administered 2014-10-24: 1000 mL via INTRAVENOUS

## 2014-10-24 NOTE — ED Notes (Signed)
MD at bedside. 

## 2014-10-24 NOTE — ED Notes (Signed)
Turned pt to the side in order to try & collect a urine sample. Pt informed that we will have to attempt a in & out cath again if this does not work.

## 2014-10-24 NOTE — ED Notes (Signed)
CRITICAL VALUE ALERT  Critical value received:  Lactic Acid 2.3  Date of notification:  10/24/14  Time of notification:  [id number]  Critical value read back:Yes.    Nurse who received alert:  S. Taysha Majewski RN  MD notified (1st page):  Dr. Leonides Schanz  Time of first page:  [phone]  MD notified (2nd page):  Time of second page:  Responding MD:  Dr. Leonides Schanz  Time MD responded:  [phone]

## 2014-10-24 NOTE — Progress Notes (Signed)
ANTIBIOTIC CONSULT NOTE-Preliminary  Pharmacy Consult for vancomycin and Zosyn Indication: rule out sepsis  Allergies  Allergen Reactions  . Diazepam     REACTION: Not sure  . Sulfamethoxazole-Trimethoprim     REACTION: Nausea    Patient Measurements: Weight: 165 lb (74.844 kg)  Vital Signs: Temp: 98.1 F (36.7 C) (04/14 2145) Temp Source: Oral (04/14 2154) BP: 159/87 mmHg (04/14 2200) Pulse Rate: 91 (04/14 2200)  Labs:  Recent Labs  10/24/14 1646  WBC 5.8  HGB 15.5  PLT 125*  CREATININE 2.01*    Microbiology: Blood culture x 2 on 4/14 at 1647 and 1653 - pending Urine culture on 4/14 at 2030 - pending  Medical History: Past Medical History  Diagnosis Date  . Spinal cord injury, cervical region   . Hypertension     Assessment: Pt is a 81 yo M being initiated on vancomycin and Zosyn for possible sepsis from likely URI. Rapid Influenza A is positive and detection of H1N, likely the source of UR symptoms. Will cover with loading dose of antibiotics while awaiting culture results. Pt with acute on chronic renal failure, SCr this visit 2.1 mg/dl (est CrCl ~62ml/min).  Goal of Therapy:  Vancomycin trough level 15-20 mcg/ml  Plan:  Preliminary review of pertinent patient information completed.  Protocol will be initiated with a one-time dose(s) of Zosyn 3.375g IV x1 and vancomycin 1g IV x 1.  Forestine Na clinical pharmacist will complete review during morning rounds to assess patient and finalize treatment regimen.  Addison Lank [place], [place] 10/24/2014,11:48 PM

## 2014-10-24 NOTE — ED Notes (Signed)
Condom cath placed for UA.

## 2014-10-24 NOTE — ED Notes (Addendum)
Fever and cough since yesterday, Sent from Dr Fransico Setters office.   Frequent UTI's

## 2014-10-24 NOTE — ED Provider Notes (Signed)
TIME SEEN: 4:20 PM  CHIEF COMPLAINT: Fever, cough, body aches  HPI: Pt is a 81 y.o. male with a history of C4 cervical injury who is wheelchair-bound, hypertension, previous history of tobacco use who presents emergency department from his primary care physician's office today with complaints of fever, body aches, cough that started yesterday. Cough has been dry, nonproductive. Fever has been as high as 101. Family states his PCP was also concerned because his oxygen level was low in the emergency department. They report he has a history of frequent urinary tract infections. He does not have to catheterize at home to urinate but does wear diapers in the day and a condom catheter at night. Denies any chest pain or shortness of breath. No vomiting or diarrhea. No rash. He does have a mild headache and body aches currently. No neck pain or neck stiffness. No known sick contacts or recent travel. Denies history of asthma or COPD. Is not on oxygen at home. Patient's wife reports he is not eating and drinking well at home but states this is chronic.  ROS: See HPI Constitutional: fever  Eyes: no drainage  ENT: no runny nose   Cardiovascular:  no chest pain  Resp: no SOB  GI: no vomiting GU: no dysuria Integumentary: no rash  Allergy: no hives  Musculoskeletal: no leg swelling  Neurological: no slurred speech ROS otherwise negative  PAST MEDICAL HISTORY/PAST SURGICAL HISTORY:  Past Medical History  Diagnosis Date  . Spinal cord injury, cervical region   . Hypertension     MEDICATIONS:  Prior to Admission medications   Not on File    ALLERGIES:  Allergies  Allergen Reactions  . Diazepam     REACTION: Not sure  . Sulfamethoxazole-Trimethoprim     REACTION: Nausea    SOCIAL HISTORY:  History  Substance Use Topics  . Smoking status: Never Smoker   . Smokeless tobacco: Not on file  . Alcohol Use: No    FAMILY HISTORY: History reviewed. No pertinent family history.  EXAM: BP  156/73 mmHg  Pulse 98  Temp(Src) 97.5 F (36.4 C) (Oral)  Resp 14  Wt 165 lb (74.844 kg)  SpO2 98% CONSTITUTIONAL: Alert and oriented and responds appropriately to questions. Well-appearing; well-nourished, elderly, nontoxic, in no distress HEAD: Normocephalic EYES: Conjunctivae clear, PERRL ENT: normal nose; no rhinorrhea; moist mucous membranes; pharynx without lesions noted NECK: Supple, no meningismus, no LAD  CARD: RRR; S1 and S2 appreciated; no murmurs, no clicks, no rubs, no gallops RESP: Normal chest excursion without splinting or tachypnea; breath sounds equal bilaterally but patient does have diffuse expiratory wheezing, no rhonchi or rales, no hypoxia ABD/GI: Normal bowel sounds; non-distended; soft, non-tender, no rebound, no guarding BACK:  The back appears normal and is non-tender to palpation, there is no CVA tenderness EXT: Normal ROM in all joints; non-tender to palpation; no edema; normal capillary refill; no cyanosis    SKIN: Normal color for age and race; warm, no rash or signs of cellulitis NEURO: Patient has a C4 injury, he has some movement of the right upper extremity but otherwise is not able to move the left upper extremity significantly or lower extremities, extremities are atrophied which is chronic PSYCH: The patient's mood and manner are appropriate. Grooming and personal hygiene are appropriate.  MEDICAL DECISION MAKING: Patient here with fever, cough. Has has a history of frequent UTIs. Hemodynamically stable and afebrile in the emergency department. We'll obtain rectal temperature. We'll obtain labs including lactate and cultures. We'll  obtain urinalysis, chest x-ray. We'll give Tylenol for his myalgias and headache. We'll give DuoNeb for his wheezing.  ED PROGRESS: Patient does have acute renal failure and elevated lactate which may be secondary to dehydration. No leukocytosis. Chest x-ray shows no infiltrate. Blood cultures are pending. We'll continue to  hydrate patient and obtain urinalysis.   Catheterized urine specimen shows hemoglobin, leukocytes and many bacteria. Culture pending. Will treat with ceftriaxone.  Lactate continues to rise despite 1.5 L IVF.  Will admit for IV antibiotics and IV hydration.  PCP is Dr. Berdine Addison.     9:40 PM  D/w with Dr. Shanon Brow for admission to telemetry, inpatient. I will place holding orders.    EKG Interpretation  Date/Time:  Thursday October 24 2014 17:06:38 EDT Ventricular Rate:  103 PR Interval:  202 QRS Duration: 85 QT Interval:  334 QTC Calculation: 437 R Axis:   24 Text Interpretation:  Sinus tachycardia Consider right atrial enlargement No significant change since last tracing Artifact Confirmed by Casey Maxfield,  DO, Warnie Belair [phone]) on 10/24/2014 5:12:39 PM        [place], DO 10/24/14 2143

## 2014-10-24 NOTE — ED Notes (Signed)
Cath pt, no urine return. EDP notified.

## 2014-10-24 NOTE — ED Notes (Signed)
CRITICAL VALUE ALERT  Critical value received:  Lactic acid 3.2  Date of notification:  10/24/14  Time of notification:  2125  Critical value read back:Yes.    Nurse who received alert:  Randa Evens RN  MD notified (1st page):  Ward  Time of first page:  2125  MD notified (2nd page):  Time of second page:  Responding MD:  Ward  Time MD responded:  2125

## 2014-10-24 NOTE — H&P (Signed)
PCP:   Maggie Font, MD   Chief Complaint:  Cough , fever  HPI: 81 yo male went to pcp today with 2 days of cough, malaise, fever, overall not feeling well.  Is quadraplegic and has freq utis.  pcp sent to ED for evaluation.  One of his home health helpers was recently sick with uri/similar symptoms.  No pain.  No n/v/d.    Review of Systems:  Positive and negative as per HPI otherwise all other systems are negative  Past Medical History: Past Medical History  Diagnosis Date  . Spinal cord injury, cervical region   . Hypertension    Past Surgical History  Procedure Laterality Date  . Cervical disc surgery    . Crohns      Medications: Prior to Admission medications   Medication Sig Start Date End Date Taking? Authorizing Provider  baclofen (LIORESAL) 10 MG tablet Take 5 mg by mouth 2 (two) times daily. 10/23/14  Yes Historical Provider, MD  cloNIDine (CATAPRES - DOSED IN MG/24 HR) 0.1 mg/24hr patch Place 0.1 mg onto the skin once a week. Thursday 10/08/14  Yes Historical Provider, MD  colestipol (COLESTID) 1 G tablet Take 1-2 g by mouth 2 (two) times daily. 2 tablet in the am and 1 tablet in the pm 10/21/14  Yes Historical Provider, MD  folic acid (FOLVITE) 1 MG tablet Take 1 mg by mouth daily.   Yes Historical Provider, MD  meclizine (ANTIVERT) 12.5 MG tablet Take 12.5 mg by mouth 3 (three) times daily as needed for dizziness.   Yes Historical Provider, MD    Allergies:   Allergies  Allergen Reactions  . Diazepam     REACTION: Not sure  . Sulfamethoxazole-Trimethoprim     REACTION: Nausea    Social History:  reports that he has never smoked. He does not have any smokeless tobacco history on file. He reports that he does not drink alcohol or use illicit drugs.  Family History: History reviewed. No pertinent family history.  Physical Exam: Filed Vitals:   10/24/14 2000 10/24/14 2030 10/24/14 2130 10/24/14 2145  BP: 137/76 156/87 137/78   Pulse: 97 101 86   Temp:     98.1 F (36.7 C)  TempSrc:    Oral  Resp: 20 24 17    Weight:      SpO2: 99% 100% 98%    General appearance: alert, cooperative and no distress Head: Normocephalic, without obvious abnormality, atraumatic Eyes: negative Nose: Nares normal. Septum midline. Mucosa normal. No drainage or sinus tenderness. Neck: no JVD and supple, symmetrical, trachea midline Lungs: clear to auscultation bilaterally Heart: regular rate and rhythm, S1, S2 normal, no murmur, click, rub or gallop Abdomen: soft, non-tender; bowel sounds normal; no masses,  no organomegaly Extremities: extremities normal, atraumatic, no cyanosis or edema Pulses: 2+ and symmetric Skin: Skin color, texture, turgor normal. No rashes or lesions Neurologic: Mental status: Alert, oriented, thought content appropriate Cranial nerves: normal    Labs on Admission:   Recent Labs  10/24/14 1646  NA 135  K 4.8  CL 99  CO2 27  GLUCOSE 99  BUN 17  CREATININE 2.01*  CALCIUM 8.8    Recent Labs  10/24/14 1646  AST 30  ALT 15  ALKPHOS 88  BILITOT 1.4*  PROT 7.7  ALBUMIN 4.0    Recent Labs  10/24/14 1646  WBC 5.8  NEUTROABS 3.8  HGB 15.5  HCT 46.1  MCV 92.0  PLT 125*   Radiological Exams on Admission:  Dg Chest 2 View  10/24/2014   CLINICAL DATA:  Fever and cough for 2 days.  Crohn' s disease.  EXAM: CHEST  2 VIEW  COMPARISON:  07/20/2007  FINDINGS: Cervical plate and screw fixator. Thoracic spondylosis. Degenerative findings in the glenohumeral joints.  Chronic scarring in the left lower lobe.  Tortuous thoracic aorta.  No pleural effusion.  The lungs appear otherwise clear.  IMPRESSION: 1. The mild left basilar opacity appears to correspond to chronic scarring in the left lower lobe, as shown on the prior CT scan from 07/20/2007. The lungs appear otherwise clear. 2. Chronic tortuosity of the thoracic aorta.  Thoracic spondylosis.   Electronically Signed   By: Van Clines M.D.   On: 10/24/2014 18:32     Assessment/Plan  81 yo male with early sepsis unclear source  Principal Problem:   Sepsis-  Likely upper respiratory.  Quick flu is pending.  cxr not impressive.  Lactic acid level elevated despite 1.5 liters of fluid, cont ivf.  Sepsis protocol.  Cover with iv vanc and zosyn.  Source could also be urine, pt has been pancultured.  Skin clear.  Active Problems:   Quadriplegia- noted   Neurogenic bladder noted   UTI (urinary tract infection)  Culture done.  Iv zosyn   Renal failure (ARF), acute on chronic-  Mildly worse than baseline of cr 1.7, now 2.1.  Monitor.  Ivf. Treat infection.   Fever   Cough  Admit to tele.  Full code.  Cartina Brousseau A 10/24/2014, 9:51 PM

## 2014-10-25 DIAGNOSIS — J1100 Influenza due to unidentified influenza virus with unspecified type of pneumonia: Principal | ICD-10-CM

## 2014-10-25 DIAGNOSIS — N319 Neuromuscular dysfunction of bladder, unspecified: Secondary | ICD-10-CM

## 2014-10-25 LAB — CBC
HEMATOCRIT: 41 % (ref 39.0–52.0)
Hemoglobin: 13.9 g/dL (ref 13.0–17.0)
MCH: 31.3 pg (ref 26.0–34.0)
MCHC: 33.9 g/dL (ref 30.0–36.0)
MCV: 92.3 fL (ref 78.0–100.0)
Platelets: 121 10*3/uL — ABNORMAL LOW (ref 150–400)
RBC: 4.44 MIL/uL (ref 4.22–5.81)
RDW: 13.8 % (ref 11.5–15.5)
WBC: 6.6 10*3/uL (ref 4.0–10.5)

## 2014-10-25 LAB — PROTIME-INR
INR: 1.15 (ref 0.00–1.49)
Prothrombin Time: 14.8 seconds (ref 11.6–15.2)

## 2014-10-25 LAB — BASIC METABOLIC PANEL
ANION GAP: 8 (ref 5–15)
BUN: 14 mg/dL (ref 6–23)
CHLORIDE: 103 mmol/L (ref 96–112)
CO2: 24 mmol/L (ref 19–32)
Calcium: 7.7 mg/dL — ABNORMAL LOW (ref 8.4–10.5)
Creatinine, Ser: 1.84 mg/dL — ABNORMAL HIGH (ref 0.50–1.35)
GFR calc non Af Amer: 31 mL/min — ABNORMAL LOW (ref >90)
GFR, EST AFRICAN AMERICAN: 36 mL/min — AB (ref >90)
Glucose, Bld: 92 mg/dL (ref 70–99)
Potassium: 4.2 mmol/L (ref 3.5–5.1)
Sodium: 135 mmol/L (ref 135–145)

## 2014-10-25 LAB — APTT: aPTT: 30 seconds (ref 24–37)

## 2014-10-25 LAB — PROCALCITONIN: PROCALCITONIN: 0.11 ng/mL

## 2014-10-25 MED ORDER — OSELTAMIVIR PHOSPHATE 30 MG PO CAPS
30.0000 mg | ORAL_CAPSULE | Freq: Every day | ORAL | Status: DC
  Administered 2014-10-25 – 2014-10-28 (×4): 30 mg via ORAL
  Filled 2014-10-25 (×4): qty 1

## 2014-10-25 MED ORDER — IPRATROPIUM-ALBUTEROL 0.5-2.5 (3) MG/3ML IN SOLN
3.0000 mL | Freq: Three times a day (TID) | RESPIRATORY_TRACT | Status: DC
  Administered 2014-10-26: 3 mL via RESPIRATORY_TRACT
  Filled 2014-10-25: qty 3

## 2014-10-25 MED ORDER — VANCOMYCIN HCL IN DEXTROSE 750-5 MG/150ML-% IV SOLN
750.0000 mg | INTRAVENOUS | Status: DC
  Filled 2014-10-25 (×3): qty 150

## 2014-10-25 MED ORDER — IPRATROPIUM-ALBUTEROL 0.5-2.5 (3) MG/3ML IN SOLN
3.0000 mL | Freq: Four times a day (QID) | RESPIRATORY_TRACT | Status: DC
  Administered 2014-10-25 (×2): 3 mL via RESPIRATORY_TRACT
  Filled 2014-10-25 (×3): qty 3

## 2014-10-25 MED ORDER — PIPERACILLIN-TAZOBACTAM 3.375 G IVPB
3.3750 g | Freq: Three times a day (TID) | INTRAVENOUS | Status: DC
  Filled 2014-10-25 (×10): qty 50

## 2014-10-25 MED ORDER — PIPERACILLIN-TAZOBACTAM 3.375 G IVPB
INTRAVENOUS | Status: AC
  Filled 2014-10-25: qty 50

## 2014-10-25 MED ORDER — VANCOMYCIN HCL IN DEXTROSE 1-5 GM/200ML-% IV SOLN
INTRAVENOUS | Status: AC
  Filled 2014-10-25: qty 200

## 2014-10-25 MED ORDER — CEFTRIAXONE SODIUM 1 G IJ SOLR
1.0000 g | INTRAMUSCULAR | Status: DC
  Administered 2014-10-25: 1 g via INTRAVENOUS
  Filled 2014-10-25 (×4): qty 10

## 2014-10-25 NOTE — Care Management Note (Addendum)
    Page 1 of 2   10/28/2014     12:48:39 PM CARE MANAGEMENT NOTE 10/28/2014  Patient:  Christopher Mcdonald, Christopher Mcdonald   Account Number:  [id number]  Date Initiated:  10/25/2014  Documentation initiated by:  Theophilus Kinds  Subjective/Objective Assessment:   Pt admitted from home with sepsis. Pt lives with his wife and will return home at discharge. Pt requires maximal assistance by the wife. Pt has a hospital bed, lift, and walker. Pts wife has been paying someone to come in and help with pt.     Action/Plan:   Will continue to follow for discharge planning needs. Pts wife stated she would think about home health services at discharge.   Anticipated DC Date:  10/28/2014   Anticipated DC Plan:  [place]  CM consult      [facility]  Resumption Of Svcs/PTA Provider   Choice offered to / List presented to:  C-3 Spouse        HH arranged  HH-1 RN  [place] agency  [place]   Status of service:  In process, will continue to follow Medicare Important Message given?  YES (If response is "NO", the following Medicare IM given date fields will be blank) Date Medicare IM given:  10/25/2014 Medicare IM given by:  Theophilus Kinds Date Additional Medicare IM given:  10/28/2014 Additional Medicare IM given by:  Theophilus Kinds  Discharge Disposition:  [place]  Per UR Regulation:    If discussed at Long Length of Stay Meetings, dates discussed:    Comments:  10/28/14 [place], RN BSN CM Pt discharged home today with resumption of [facility] HH RN, PT, and aide. Sheppard Evens with [facility] aware and will collect pts information from the chart. [place] services to resume within 48 hours of discharge. No DME needs noted. Pt and pts nurse aware of discharge arrangements.  10/25/14 [place], RN BSN CM Notified by [employer] that pt is active for RN and PT. Pts wife wants to add CNA  at discharge. Weekend staff to call and fax orders to [facility] if pt discharges over the weekend. No DME needs noted at this time.  10/25/14 [place], RN BSN CM
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
## 2014-10-25 NOTE — Care Management Utilization Note (Signed)
UR completed 

## 2014-10-25 NOTE — Progress Notes (Signed)
[place] for Vancomycin & Zosyn Indication: rule out sepsis  Allergies  Allergen Reactions  . Diazepam     REACTION: Not sure  . Sulfamethoxazole-Trimethoprim     REACTION: Nausea    Patient Measurements: Height: 5\' 9"  (175.3 cm) Weight: 143 lb 15.4 oz (65.3 kg) IBW/kg (Calculated) : 70.7  Vital Signs: Temp: 98 F (36.7 C) (04/15 0632) Temp Source: Oral (04/15 [id number]) BP: 153/91 mmHg (04/15 [id number]) Pulse Rate: 114 (04/15 [id number]) Intake/Output from previous day: 04/14 0701 - 04/15 0700 In: 1500 [I.V.:1500] Out: 1400 [Urine:1400] Intake/Output from this shift:    Labs:  Recent Labs  10/24/14 1646 10/25/14 0717  WBC 5.8 6.6  HGB 15.5 13.9  PLT 125* 121*  CREATININE 2.01* 1.84*   Estimated Creatinine Clearance: 24.6 mL/min (by C-G formula based on Cr of 1.84). No results for input(s): VANCOTROUGH, VANCOPEAK, VANCORANDOM, GENTTROUGH, GENTPEAK, GENTRANDOM, TOBRATROUGH, TOBRAPEAK, TOBRARND, AMIKACINPEAK, AMIKACINTROU, AMIKACIN in the last 72 hours.   Microbiology: Recent Results (from the past 720 hour(s))  Blood culture (routine x 2)     Status: None (Preliminary result)   Collection Time: 10/24/14  4:47 PM  Result Value Ref Range Status   Specimen Description BLOOD RIGHT ANTECUBITAL  Final   Special Requests   Final    BOTTLES DRAWN AEROBIC AND ANAEROBIC AEB=12CC ANA=6CC   Culture NO GROWTH 1 DAY  Final   Report Status PENDING  Incomplete  Blood culture (routine x 2)     Status: None (Preliminary result)   Collection Time: 10/24/14  4:53 PM  Result Value Ref Range Status   Specimen Description BLOOD LEFT HAND  Final   Special Requests BOTTLES DRAWN AEROBIC ONLY 4CC  Final   Culture NO GROWTH 1 DAY  Final   Report Status PENDING  Incomplete    Anti-infectives    Start     Dose/Rate Route Frequency Ordered Stop   10/25/14 1000  oseltamivir (TAMIFLU) capsule 30 mg     30 mg Oral Daily 10/25/14 0718 10/30/14 0959   10/25/14 0100   vancomycin (VANCOCIN) IVPB 1000 mg/200 mL premix     1,000 mg 200 mL/hr over 60 Minutes Intravenous  Once 10/24/14 2357 10/25/14 0143   10/25/14 0000  piperacillin-tazobactam (ZOSYN) IVPB 3.375 g     3.375 g 100 mL/hr over 30 Minutes Intravenous  Once 10/24/14 2346 10/25/14 0113   10/24/14 2115  cefTRIAXone (ROCEPHIN) 1 g in dextrose 5 % 50 mL IVPB     1 g 100 mL/hr over 30 Minutes Intravenous  Once 10/24/14 2114 10/24/14 2252      Assessment: 81 yo M presented with fever, dry cough, body aches.  He was started on Tamiflu for + Influenza and Vanc/Zosyn for possible sepsis given hx frequent UTIs, elevated lactic acid.  He is currently afebrile.   Cx data pending.  CXR negative for PNA.  Acute renal failure improving.     Vancomycin 4/15>> Zosyn 4/15>>  Goal of Therapy:  Vancomycin trough level 15-20 mcg/ml  Plan:  Zosyn 3.375gm IV Q8h to be infused over 4hrs Vancomycin 750mg  IV q24h Check Vancomycin trough at steady state Decrease Tamiflu to 30mg  po daily x 5 days for treatment Influenza for CrCl 10-58ml/min Monitor renal function and cx data   Christopher Mcdonald 10/25/2014,8:31 AM

## 2014-10-26 ENCOUNTER — Inpatient Hospital Stay (HOSPITAL_COMMUNITY): Payer: Medicare PPO

## 2014-10-26 DIAGNOSIS — J1100 Influenza due to unidentified influenza virus with unspecified type of pneumonia: Secondary | ICD-10-CM

## 2014-10-26 HISTORY — DX: Influenza due to unidentified influenza virus with unspecified type of pneumonia: J11.00

## 2014-10-26 MED ORDER — CEFUROXIME AXETIL 250 MG PO TABS
250.0000 mg | ORAL_TABLET | Freq: Two times a day (BID) | ORAL | Status: DC
  Administered 2014-10-26 – 2014-10-28 (×4): 250 mg via ORAL
  Filled 2014-10-26 (×5): qty 1
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
## 2014-10-26 NOTE — Progress Notes (Signed)
Pt has no IV access at this time. MD made aware
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
## 2014-10-27 NOTE — Plan of Care (Signed)
Problem: Phase I Progression Outcomes Goal: OOB as tolerated unless otherwise ordered Outcome: Completed/Met Date Met:  10/27/14 Patient is a quadriplegic and is bedbound  Problem: Phase II Progression Outcomes Goal: Voiding independently Outcome: Adequate for Discharge Neurogenic bladder, chronic foley

## 2014-10-28 LAB — URINE CULTURE: Colony Count: >=100000

## 2014-10-28 MED ORDER — ALBUTEROL SULFATE (2.5 MG/3ML) 0.083% IN NEBU: 2.5000 mg | INHALATION_SOLUTION | RESPIRATORY_TRACT | Status: DC | PRN

## 2014-10-28 MED ORDER — OSELTAMIVIR PHOSPHATE 30 MG PO CAPS: 30.0000 mg | ORAL_CAPSULE | Freq: Every day | ORAL | Status: DC

## 2014-10-28 MED ORDER — CEFUROXIME AXETIL 250 MG PO TABS: 250.0000 mg | ORAL_TABLET | Freq: Two times a day (BID) | ORAL | Status: DC

## 2014-10-28 NOTE — Progress Notes (Signed)
Patient refused to have labs drawn this morning.

## 2014-10-28 NOTE — Progress Notes (Signed)
UR chart review completed.  
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
## 2014-10-28 NOTE — Clinical Documentation Improvement (Signed)
Supporting Information: Acute on chronic renal failure per 4/14 progress notes.   Labs: Bun /   Creat /   GFR: 4/15: 14 /   1.84 /   36 4/14: 17 /   2.01 /   32   Possible Clinical Condition: . Document the stage of CKD --Chronic kidney disease, stage 1- GFR > OR = 90 --Chronic kidney disease, stage 2 (mild) - GFR 60-89 --Chronic kidney disease, stage 3 (moderate) - GFR 30-59 --Chronic kidney disease, stage 4 (severe) - GFR 15-29 --Chronic kidney disease, stage 5- GFR < 15 --End-stage renal disease (ESRD) . Document any underlying cause of CKD such as Diabetes or Hypertension . Document if the patient is dependent on Dialysis . Chronic renal failure without a documented stage will be assigned to Chronic kidney disease, unspecified . Document any associated diagnoses/conditions     Thank Sherian Maroon Documentation Specialist [phone] [email or web address] [email or web address]

## 2014-10-29 LAB — CULTURE, BLOOD (ROUTINE X 2)
CULTURE: NO GROWTH
Culture: NO GROWTH

## 2017-02-17 ENCOUNTER — Encounter (HOSPITAL_COMMUNITY): Payer: Self-pay | Admitting: *Deleted

## 2017-02-17 ENCOUNTER — Other Ambulatory Visit (HOSPITAL_COMMUNITY): Payer: Medicare Other

## 2017-02-17 ENCOUNTER — Emergency Department (HOSPITAL_COMMUNITY): Payer: Medicare Other

## 2017-02-17 ENCOUNTER — Observation Stay (HOSPITAL_COMMUNITY): Payer: Medicare Other

## 2017-02-17 ENCOUNTER — Observation Stay (HOSPITAL_COMMUNITY)
Admission: EM | Admit: 2017-02-17 | Discharge: 2017-02-21 | Disposition: A | Discharge status: Home / Self Care | Payer: Medicare Other | Attending: Family Medicine | Admitting: Family Medicine

## 2017-02-17 DIAGNOSIS — R935 Abnormal findings on diagnostic imaging of other abdominal regions, including retroperitoneum: Secondary | ICD-10-CM | POA: Diagnosis present

## 2017-02-17 DIAGNOSIS — N390 Urinary tract infection, site not specified: Secondary | ICD-10-CM

## 2017-02-17 DIAGNOSIS — L8990 Pressure ulcer of unspecified site, unspecified stage: Secondary | ICD-10-CM | POA: Diagnosis present

## 2017-02-17 DIAGNOSIS — Z79899 Other long term (current) drug therapy: Secondary | ICD-10-CM | POA: Diagnosis not present

## 2017-02-17 DIAGNOSIS — Q453 Other congenital malformations of pancreas and pancreatic duct: Secondary | ICD-10-CM

## 2017-02-17 DIAGNOSIS — N183 Chronic kidney disease, stage 3 (moderate): Secondary | ICD-10-CM | POA: Diagnosis not present

## 2017-02-17 DIAGNOSIS — I10 Essential (primary) hypertension: Secondary | ICD-10-CM | POA: Diagnosis present

## 2017-02-17 DIAGNOSIS — N184 Chronic kidney disease, stage 4 (severe): Secondary | ICD-10-CM | POA: Diagnosis present

## 2017-02-17 DIAGNOSIS — R1084 Generalized abdominal pain: Secondary | ICD-10-CM

## 2017-02-17 DIAGNOSIS — G8250 Quadriplegia, unspecified: Secondary | ICD-10-CM | POA: Diagnosis present

## 2017-02-17 DIAGNOSIS — B952 Enterococcus as the cause of diseases classified elsewhere: Secondary | ICD-10-CM | POA: Diagnosis present

## 2017-02-17 DIAGNOSIS — K8050 Calculus of bile duct without cholangitis or cholecystitis without obstruction: Secondary | ICD-10-CM | POA: Diagnosis present

## 2017-02-17 DIAGNOSIS — R319 Hematuria, unspecified: Secondary | ICD-10-CM | POA: Diagnosis not present

## 2017-02-17 DIAGNOSIS — I129 Hypertensive chronic kidney disease with stage 1 through stage 4 chronic kidney disease, or unspecified chronic kidney disease: Secondary | ICD-10-CM | POA: Diagnosis not present

## 2017-02-17 DIAGNOSIS — R1032 Left lower quadrant pain: Secondary | ICD-10-CM | POA: Diagnosis present

## 2017-02-17 DIAGNOSIS — K8080 Other cholelithiasis without obstruction: Secondary | ICD-10-CM | POA: Diagnosis not present

## 2017-02-17 DIAGNOSIS — K8020 Calculus of gallbladder without cholecystitis without obstruction: Secondary | ICD-10-CM | POA: Diagnosis not present

## 2017-02-17 DIAGNOSIS — N319 Neuromuscular dysfunction of bladder, unspecified: Secondary | ICD-10-CM | POA: Diagnosis present

## 2017-02-17 DIAGNOSIS — K5900 Constipation, unspecified: Secondary | ICD-10-CM | POA: Diagnosis present

## 2017-02-17 HISTORY — DX: Influenza due to unidentified influenza virus with unspecified type of pneumonia: J11.00

## 2017-02-17 HISTORY — DX: Other congenital malformations of pancreas and pancreatic duct: Q45.3

## 2017-02-17 HISTORY — DX: Calculus of bile duct without cholangitis or cholecystitis without obstruction: K80.50

## 2017-02-17 LAB — URINALYSIS, ROUTINE W REFLEX MICROSCOPIC
BILIRUBIN URINE: NEGATIVE
GLUCOSE, UA: NEGATIVE mg/dL
Ketones, ur: NEGATIVE mg/dL
Nitrite: POSITIVE — AB
PH: 6 (ref 5.0–8.0)
Protein, ur: 30 mg/dL — AB
Specific Gravity, Urine: 1.019 (ref 1.005–1.030)
Squamous Epithelial / LPF: NONE SEEN

## 2017-02-17 LAB — CBC WITH DIFFERENTIAL/PLATELET
Basophils Absolute: 0 10*3/uL (ref 0.0–0.1)
Basophils Relative: 0 %
EOS ABS: 0.1 10*3/uL (ref 0.0–0.7)
EOS PCT: 1 %
HCT: 42.1 % (ref 39.0–52.0)
Hemoglobin: 14.2 g/dL (ref 13.0–17.0)
LYMPHS ABS: 2.8 10*3/uL (ref 0.7–4.0)
LYMPHS PCT: 23 %
MCH: 30.5 pg (ref 26.0–34.0)
MCHC: 33.7 g/dL (ref 30.0–36.0)
MCV: 90.5 fL (ref 78.0–100.0)
MONO ABS: 1.6 10*3/uL — AB (ref 0.1–1.0)
Monocytes Relative: 13 %
Neutro Abs: 7.9 10*3/uL — ABNORMAL HIGH (ref 1.7–7.7)
Neutrophils Relative %: 63 %
PLATELETS: 169 10*3/uL (ref 150–400)
RBC: 4.65 MIL/uL (ref 4.22–5.81)
RDW: 13.8 % (ref 11.5–15.5)
WBC: 12.4 10*3/uL — ABNORMAL HIGH (ref 4.0–10.5)

## 2017-02-17 LAB — COMPREHENSIVE METABOLIC PANEL
ALT: 24 U/L (ref 17–63)
ANION GAP: 8 (ref 5–15)
AST: 19 U/L (ref 15–41)
Albumin: 3.3 g/dL — ABNORMAL LOW (ref 3.5–5.0)
Alkaline Phosphatase: 89 U/L (ref 38–126)
BILIRUBIN TOTAL: 1.7 mg/dL — AB (ref 0.3–1.2)
BUN: 26 mg/dL — ABNORMAL HIGH (ref 6–20)
CO2: 25 mmol/L (ref 22–32)
Calcium: 8.8 mg/dL — ABNORMAL LOW (ref 8.9–10.3)
Chloride: 102 mmol/L (ref 101–111)
Creatinine, Ser: 1.94 mg/dL — ABNORMAL HIGH (ref 0.61–1.24)
GFR calc Af Amer: 33 mL/min — ABNORMAL LOW (ref >60)
GFR, EST NON AFRICAN AMERICAN: 28 mL/min — AB (ref >60)
Glucose, Bld: 90 mg/dL (ref 65–99)
POTASSIUM: 4.4 mmol/L (ref 3.5–5.1)
SODIUM: 135 mmol/L (ref 135–145)
TOTAL PROTEIN: 7.5 g/dL (ref 6.5–8.1)

## 2017-02-17 LAB — LIPASE, BLOOD: LIPASE: 28 U/L (ref 11–51)

## 2017-02-17 MED ORDER — SODIUM CHLORIDE 0.9 % IV SOLN
INTRAVENOUS | Status: DC
  Administered 2017-02-17 – 2017-02-19 (×3): via INTRAVENOUS

## 2017-02-17 MED ORDER — MECLIZINE HCL 12.5 MG PO TABS: 12.5000 mg | ORAL_TABLET | Freq: Three times a day (TID) | ORAL | Status: DC | PRN

## 2017-02-17 MED ORDER — CLONIDINE HCL 0.1 MG/24HR TD PTWK: 0.1000 mg | MEDICATED_PATCH | TRANSDERMAL | Status: DC

## 2017-02-17 MED ORDER — ALBUTEROL SULFATE (2.5 MG/3ML) 0.083% IN NEBU: 2.5000 mg | INHALATION_SOLUTION | RESPIRATORY_TRACT | Status: DC | PRN

## 2017-02-17 MED ORDER — SODIUM CHLORIDE 0.9 % IV BOLUS (SEPSIS)
1000.0000 mL | Freq: Once | INTRAVENOUS | Status: AC
  Administered 2017-02-17: 1000 mL via INTRAVENOUS

## 2017-02-17 MED ORDER — COLESTIPOL HCL 1 G PO TABS: 1.0000 g | ORAL_TABLET | Freq: Two times a day (BID) | ORAL | Status: DC

## 2017-02-17 MED ORDER — CEFTRIAXONE SODIUM 1 G IJ SOLR
1.0000 g | Freq: Once | INTRAMUSCULAR | Status: AC
  Administered 2017-02-17: 1 g via INTRAVENOUS
  Filled 2017-02-17: qty 10

## 2017-02-17 MED ORDER — POLYVINYL ALCOHOL 1.4 % OP SOLN
1.0000 [drp] | Freq: Every day | OPHTHALMIC | Status: DC
  Filled 2017-02-17: qty 15

## 2017-02-17 MED ORDER — METOPROLOL TARTRATE 5 MG/5ML IV SOLN
INTRAVENOUS | Status: AC
  Administered 2017-02-17: 5 mg via INTRAVENOUS
  Filled 2017-02-17: qty 5

## 2017-02-17 MED ORDER — FOLIC ACID 1 MG PO TABS
1.0000 mg | ORAL_TABLET | Freq: Every day | ORAL | Status: DC
  Administered 2017-02-18 – 2017-02-21 (×4): 1 mg via ORAL
  Filled 2017-02-17 (×4): qty 1

## 2017-02-17 MED ORDER — BACLOFEN 10 MG PO TABS
5.0000 mg | ORAL_TABLET | Freq: Two times a day (BID) | ORAL | Status: DC
  Administered 2017-02-17 – 2017-02-21 (×8): 5 mg via ORAL
  Filled 2017-02-17 (×9): qty 1

## 2017-02-17 MED ORDER — CARBOXYMETHYLCELLUL-GLYCERIN 1-0.25 % OP SOLN: Freq: Every day | OPHTHALMIC | Status: DC

## 2017-02-17 MED ORDER — DEXTROSE 5 % IV SOLN
1.0000 g | INTRAVENOUS | Status: DC
  Administered 2017-02-18: 1 g via INTRAVENOUS
  Filled 2017-02-17 (×3): qty 10

## 2017-02-17 MED ORDER — ACETAMINOPHEN 650 MG RE SUPP: 650.0000 mg | Freq: Four times a day (QID) | RECTAL | Status: DC | PRN

## 2017-02-17 MED ORDER — ONDANSETRON HCL 4 MG PO TABS: 4.0000 mg | ORAL_TABLET | Freq: Four times a day (QID) | ORAL | Status: DC | PRN

## 2017-02-17 MED ORDER — OSELTAMIVIR PHOSPHATE 30 MG PO CAPS
30.0000 mg | ORAL_CAPSULE | Freq: Every day | ORAL | Status: DC
Start: 2017-02-17 — End: 2017-02-17

## 2017-02-17 MED ORDER — IOPAMIDOL (ISOVUE-300) INJECTION 61%
75.0000 mL | Freq: Once | INTRAVENOUS | Status: AC | PRN
  Administered 2017-02-17: 75 mL via INTRAVENOUS

## 2017-02-17 MED ORDER — ONDANSETRON HCL 4 MG/2ML IJ SOLN: 4.0000 mg | Freq: Four times a day (QID) | INTRAMUSCULAR | Status: DC | PRN

## 2017-02-17 MED ORDER — HEPARIN SODIUM (PORCINE) 5000 UNIT/ML IJ SOLN
5000.0000 [IU] | Freq: Three times a day (TID) | INTRAMUSCULAR | Status: DC
  Administered 2017-02-17: 5000 [IU] via SUBCUTANEOUS
  Filled 2017-02-17: qty 1

## 2017-02-17 MED ORDER — ACETAMINOPHEN 325 MG PO TABS
650.0000 mg | ORAL_TABLET | Freq: Four times a day (QID) | ORAL | Status: DC | PRN
  Administered 2017-02-19 – 2017-02-20 (×3): 650 mg via ORAL
  Filled 2017-02-17 (×3): qty 2

## 2017-02-17 MED ORDER — POLYETHYLENE GLYCOL 3350 17 G PO PACK
17.0000 g | PACK | Freq: Two times a day (BID) | ORAL | Status: DC
  Administered 2017-02-17 – 2017-02-21 (×7): 17 g via ORAL
  Filled 2017-02-17 (×7): qty 1

## 2017-02-17 MED ORDER — METOPROLOL TARTRATE 5 MG/5ML IV SOLN
5.0000 mg | Freq: Once | INTRAVENOUS | Status: AC
  Administered 2017-02-17: 5 mg via INTRAVENOUS

## 2017-02-17 NOTE — H&P (Signed)
History and Physical    Christopher Mcdonald YSA:[id number] DOB: 07/06/1923 DOA: 02/17/2017  Referring MD/NP/PA: Dayna Barker  PCP: Iona Beard, MD (Confirm with patient/family/NH records and if not entered, this has to be entered at [facility] point of entry) Outpatient Specialists: none ([facility] speciality and name if known) Patient coming from: Home  Chief Complaint: LLQ Abd Pain, UTI, cholelithiasis  HPI: Christopher Mcdonald is a 83 y.o. male with medical history significant of quadriplegia, neurogenic bladder, AKI presenting w/ LLQ abd pain, UTI. Per family, patient with persistent lower abdominal pain as well as generalized malaise over the past 2 weeks. No reported nausea vomiting, fevers or chills. Had one episode of nonbilious nonbloody diarrhea per wife. Has a history of neurogenic bladder as well as prior history of UTIs. Family unsure if symptoms are similar to previous UTI. Per the family, patient was evaluated at his primary care physician's office. Lab work was drawn and patient was referred to ER for further evaluation.    ED Course: Presented to the ER afebrile, hemodynamically stable. White count 12.4. Creatinine 1.9. T bili 1.7. LFTs WNL.  Urinalysis indicative of infection. CT abd/pelvis obtained:  1. 2 mm stone along the course of the lower common bile duct, without duct dilatation. Cholelithiasis. Please correlate with liver function tests. 2. Diffuse formed stool in the remaining colon, correlate for constipation. There is mild circumferential thickening of low rectal wall but no fat inflammation to suggest a proctitis.  CXR WNL Review of Systems: As per HPI otherwise 10 point review of systems negative.    Past Medical History:  Diagnosis Date  . Hypertension   . Spinal cord injury, cervical region [facility])     Past Surgical History:  Procedure Laterality Date  . CERVICAL DISC SURGERY       reports that he has never smoked. He has never used smokeless tobacco. He reports that he  does not drink alcohol or use drugs.  Allergies  Allergen Reactions  . Diazepam     REACTION: Not sure  . Sulfamethoxazole-Trimethoprim     REACTION: Nausea    No family history on file. Unacceptable: Noncontributory, unremarkable, or negative. Acceptable: Family history reviewed and not pertinent (If you reviewed it)  Prior to Admission medications   Medication Sig Start Date End Date Taking? Authorizing Provider  baclofen (LIORESAL) 10 MG tablet Take 5 mg by mouth 2 (two) times daily. 10/23/14  Yes [provider]  Carboxymethylcellul-Glycerin (CLEAR EYES FOR DRY EYES OP) Apply 1 drop to eye daily.   Yes [provider]  cloNIDine (CATAPRES - DOSED IN MG/24 HR) 0.1 mg/24hr patch Place 0.1 mg onto the skin once a week. Thursday 10/08/14  Yes [provider]  colestipol (COLESTID) 1 G tablet Take 1-2 g by mouth 2 (two) times daily. 2 tablet in the am and 1 tablet in the pm 10/21/14  Yes [provider]  meclizine (ANTIVERT) 12.5 MG tablet Take 12.5 mg by mouth 3 (three) times daily as needed for dizziness.   Yes [provider]  albuterol (PROVENTIL) (2.5 MG/3ML) 0.083% nebulizer solution Take 3 mLs (2.5 mg total) by nebulization every 2 (two) hours as needed for wheezing. Patient not taking: Reported on 02/17/2017 10/28/14   Orvan Falconer, MD  cefUROXime (CEFTIN) 250 MG tablet Take 1 tablet (250 mg total) by mouth 2 (two) times daily with a meal. Patient not taking: Reported on 02/17/2017 10/28/14   Orvan Falconer, MD  folic acid (FOLVITE) 1 MG tablet Take 1  mg by mouth daily.    [provider]  oseltamivir (TAMIFLU) 30 MG capsule Take 1 capsule (30 mg total) by mouth daily. Patient not taking: Reported on 02/17/2017 10/28/14   Orvan Falconer, MD    Physical Exam: Vitals:   02/17/17 1300 02/17/17 1413 02/17/17 1414 02/17/17 1430  BP: (!) 148/77 (!) 180/88  (!) 175/92  Pulse: 85  95 92  Resp:      Temp:      TempSrc:      SpO2: 97%  98% 96%  Weight:       Height:          Constitutional: NAD, calm, comfortable Vitals:   02/17/17 1300 02/17/17 1413 02/17/17 1414 02/17/17 1430  BP: (!) 148/77 (!) 180/88  (!) 175/92  Pulse: 85  95 92  Resp:      Temp:      TempSrc:      SpO2: 97%  98% 96%  Weight:      Height:       Eyes: PERRL, lids and conjunctivae normal ENMT: Mucous membranes are moist. Posterior pharynx clear of any exudate or lesions.Normal dentition.  Neck: normal, supple, no masses, no thyromegaly Respiratory: clear to auscultation bilaterally, no wheezing, no crackles. Normal respiratory effort. No accessory muscle use.  Cardiovascular: Regular rate and rhythm, no murmurs / rubs / gallops. No extremity edema. 2+ pedal pulses. No carotid bruits.  Abdomen: + mild LLQ TTP, no masses palpated. No hepatosplenomegaly. Bowel sounds positive.  Musculoskeletal: no clubbing / cyanosis. No joint deformity upper and lower extremities. Baseline quadriparesis  Skin: no rashes, lesions, ulcers. No induration Neurologic: CN 2-12 grossly intact.Quadriplegic Psychiatric: Normal judgment and insight. Alert and oriented x 3. Normal mood.   Labs on Admission: I have personally reviewed following labs and imaging studies  CBC:  Recent Labs Lab 02/17/17 1140  WBC 12.4*  NEUTROABS 7.9*  HGB 14.2  HCT 42.1  MCV 90.5  PLT 546   Basic Metabolic Panel:  Recent Labs Lab 02/17/17 1140  NA 135  K 4.4  CL 102  CO2 25  GLUCOSE 90  BUN 26*  CREATININE 1.94*  CALCIUM 8.8*   GFR: Estimated Creatinine Clearance: 24.3 mL/min (A) (by C-G formula based on SCr of 1.94 mg/dL (H)). Liver Function Tests:  Recent Labs Lab 02/17/17 1140  AST 19  ALT 24  ALKPHOS 89  BILITOT 1.7*  PROT 7.5  ALBUMIN 3.3*    Recent Labs Lab 02/17/17 1140  LIPASE 28   No results for input(s): AMMONIA in the last 168 hours. Coagulation Profile: No results for input(s): INR, PROTIME in the last 168 hours. Cardiac Enzymes: No results for  input(s): CKTOTAL, CKMB, CKMBINDEX, TROPONINI in the last 168 hours. BNP (last 3 results) No results for input(s): PROBNP in the last 8760 hours. HbA1C: No results for input(s): HGBA1C in the last 72 hours. CBG: No results for input(s): GLUCAP in the last 168 hours. Lipid Profile: No results for input(s): CHOL, HDL, LDLCALC, TRIG, CHOLHDL, LDLDIRECT in the last 72 hours. Thyroid Function Tests: No results for input(s): TSH, T4TOTAL, FREET4, T3FREE, THYROIDAB in the last 72 hours. Anemia Panel: No results for input(s): VITAMINB12, FOLATE, FERRITIN, TIBC, IRON, RETICCTPCT in the last 72 hours. Urine analysis:    Component Value Date/Time   COLORURINE YELLOW 02/17/2017 1415   APPEARANCEUR CLOUDY (A) 02/17/2017 1415   LABSPEC 1.019 02/17/2017 1415   PHURINE 6.0 02/17/2017 1415   GLUCOSEU NEGATIVE 02/17/2017 1415   HGBUR LARGE (  A) 02/17/2017 1415   HGBUR moderate 10/04/2008 1432   BILIRUBINUR NEGATIVE 02/17/2017 1415   KETONESUR NEGATIVE 02/17/2017 1415   PROTEINUR 30 (A) 02/17/2017 1415   UROBILINOGEN 0.2 10/24/2014 2030   NITRITE POSITIVE (A) 02/17/2017 1415   LEUKOCYTESUR LARGE (A) 02/17/2017 1415   Sepsis Labs: @LABRCNTIP (procalcitonin:4,lacticidven:4) )No results found for this or any previous visit (from the past 240 hour(s)).   Radiological Exams on Admission: Dg Chest 1 View  Result Date: 02/17/2017 CLINICAL DATA:  Evaluate for pneumonia.  Cough EXAM: CHEST 1 VIEW COMPARISON:  10/26/2014 FINDINGS: Limited rotated chest. Normal heart size. Aortic tortuosity. Chronic linear densities at the left base consistent with atelectasis. No edema, effusion, or pneumothorax. No air bronchograms. IMPRESSION: 1. No acute finding.  No evidence of pneumonia. 2. Mild scarring at the left base. Electronically Signed   By: Monte Fantasia M.D.   On: 02/17/2017 14:00   Ct Abdomen Pelvis W Contrast  Result Date: 02/17/2017 CLINICAL DATA:  Abdominal pain.  Diverticulitis suspected. EXAM: CT  ABDOMEN AND PELVIS WITH CONTRAST TECHNIQUE: Multidetector CT imaging of the abdomen and pelvis was performed using the standard protocol following bolus administration of intravenous contrast. CONTRAST:  30mL ISOVUE-300 IOPAMIDOL (ISOVUE-300) INJECTION 61% COMPARISON:  None. FINDINGS: Lower chest:  No acute finding.  Coronary atherosclerosis. Hepatobiliary: Multiple small cystic appearing low densities in the liver.Cholelithiasis. No evidence of biliary obstruction or inflammation. There is a 2 mm calcification in line with the nondilated lower common bile duct. Pancreas: Generalized pancreas atrophy. No duct dilatation or acute inflammation. Spleen: Unremarkable. Adrenals/Urinary Tract: Negative adrenals. No hydronephrosis or stone. Symmetric renal atrophy. Renal cysts present. Circumferential bladder wall thickening with cellules compatible with chronic outlet obstruction. Stomach/Bowel: Previous proximal colon resection. Formed stool throughout most of the colon with mild circumferential rectal wall thickening. No fat inflammation. Vascular/Lymphatic: No acute vascular abnormality. Diffuse atherosclerotic calcification of the aorta and its branches. Notable noncalcified plaque on the proximal SMA without suspected flow limiting stenosis. Narrow appearance of the celiac axis, likely combination of median arcuate ligament and atherosclerosis. No mass or adenopathy. Reproductive:Mild symmetric prostate enlargement. Question previous trans urethral prostate resection. Other: No ascites or pneumoperitoneum. Postoperative ventral abdominal wall laxity. Musculoskeletal: Spondyloarthropathy with diffuse spinal ankylosis. Prominent osteopenia in the lower lumbar spine and pelvis. Few sacroiliac joints. IMPRESSION: 1. 2 mm stone along the course of the lower common bile duct, without duct dilatation. Cholelithiasis. Please correlate with liver function tests. 2. Diffuse formed stool in the remaining colon, correlate for  constipation. There is mild circumferential thickening of low rectal wall but no fat inflammation to suggest a proctitis. 3. Changes of chronic bladder outlet obstruction. 4.  Aortic Atherosclerosis (ICD10-I70.0). 5. Spondyloarthropathy with diffuse ankylosis. Electronically Signed   By: Monte Fantasia M.D.   On: 02/17/2017 13:53    EKG:not performed  Assessment/Plan Active Problems:   UTI (urinary tract infection)   LLQ pain   Cholelithiasis  1-LLQ Pain  -CT consistent w/ constipation  -start on miralax  - follow  2-UTI  -start on Rocephin  -follow urine culture  3-Cholelithiasis  -Abd u/s pending  -per Dr. Dayna Barker, case discussed w/ Dr. Felipe Drone -no overt signs of cholecystitis at present -f/u GI recs as needed   4-HTN -BP stable  -titrate home regimen     DVT prophylaxis: sub q heparin   Code Status: Full Code   Family Communication: Plan discussed w/ wife, son  at bedside   Disposition Plan: pending further evaluation  Consults called: [facility]  Admission status: Obs   Deneise Lever MD [facility] Pager [phone]  If 7PM-7AM, please contact night-coverage [email or web address] Password [email or web address]  02/17/2017, 3:59 PM

## 2017-02-17 NOTE — ED Notes (Signed)
ED Provider at bedside. 

## 2017-02-17 NOTE — ED Provider Notes (Signed)
Christopher Mcdonald Provider Note   CSN: [id number] Arrival date & time: 02/17/17  1112     History   Chief Complaint Chief Complaint  Patient presents with  . Abdominal Pain    HPI Christopher Mcdonald is a 83 y.o. male.   Abdominal Pain   This is a new problem. The current episode started yesterday. The problem occurs constantly. The problem has been gradually worsening. The pain is associated with an unknown factor. The pain is located in the suprapubic region and LLQ. Associated symptoms include fever.    Past Medical History:  Diagnosis Date  . Hypertension   . Spinal cord injury, cervical region [facility])     Patient Active Problem List   Diagnosis Date Noted  . LLQ pain 02/17/2017  . Cholelithiasis 02/17/2017  . Influenza with pneumonia 10/26/2014  . UTI (urinary tract infection) 10/24/2014  . Renal failure (ARF), acute on chronic (HCC) 10/24/2014  . Sepsis ([place]) 10/24/2014  . Fever 10/24/2014  . Cough 10/24/2014  . Acute renal failure syndrome ([place])   . Elevated lactic acid level   . Urinary tract infectious disease   . NECK PAIN, RIGHT 12/30/2008  . DYSPHAGIA UNSPECIFIED 12/27/2008  . OTHER SEBORRHEIC DERMATITIS 10/04/2008  . PRESSURE ULCER BUTTOCK 08/24/2007  . RESTRICTIVE LUNG DISEASE 06/21/2007  . Quadriplegia ([place]) 09/08/2006  . HYPERLIPIDEMIA 06/06/2006  . DEPRESSION 06/06/2006  . CROHN'S DISEASE 06/06/2006  . IBS 06/06/2006  . KIDNEY DISEASE, CHRONIC, STAGE III 06/06/2006  . Neurogenic bladder 06/06/2006  . INCONTINENCE 06/06/2006  . SPINAL CORD INJURY 06/06/2006    Past Surgical History:  Procedure Laterality Date  . CERVICAL DISC SURGERY         Home Medications    Prior to Admission medications   Medication Sig Start Date End Date Taking? Authorizing Provider  baclofen (LIORESAL) 10 MG tablet Take 5 mg by mouth 2 (two) times daily. 10/23/14  Yes [provider]  Carboxymethylcellul-Glycerin (CLEAR EYES FOR DRY EYES OP) Apply 1  drop to eye daily.   Yes [provider]  cloNIDine (CATAPRES - DOSED IN MG/24 HR) 0.1 mg/24hr patch Place 0.1 mg onto the skin once a week. Thursday 10/08/14  Yes [provider]  colestipol (COLESTID) 1 G tablet Take 1-2 g by mouth 2 (two) times daily. 2 tablet in the am and 1 tablet in the pm 10/21/14  Yes [provider]  meclizine (ANTIVERT) 12.5 MG tablet Take 12.5 mg by mouth 3 (three) times daily as needed for dizziness.   Yes [provider]  albuterol (PROVENTIL) (2.5 MG/3ML) 0.083% nebulizer solution Take 3 mLs (2.5 mg total) by nebulization every 2 (two) hours as needed for wheezing. Patient not taking: Reported on 02/17/2017 10/28/14   Orvan Falconer, MD  cefUROXime (CEFTIN) 250 MG tablet Take 1 tablet (250 mg total) by mouth 2 (two) times daily with a meal. Patient not taking: Reported on 02/17/2017 10/28/14   Orvan Falconer, MD  folic acid (FOLVITE) 1 MG tablet Take 1 mg by mouth daily.    [provider]  oseltamivir (TAMIFLU) 30 MG capsule Take 1 capsule (30 mg total) by mouth daily. Patient not taking: Reported on 02/17/2017 10/28/14   Orvan Falconer, MD    Family History No family history on file.  Social History Social History  Substance Use Topics  . Smoking status: Never Smoker  . Smokeless tobacco: Never Used  . Alcohol use No     Allergies   Diazepam and Sulfamethoxazole-trimethoprim  Review of Systems Review of Systems  Constitutional: Positive for activity change, fatigue and fever.  Gastrointestinal: Positive for abdominal pain.  All other systems reviewed and are negative.    Physical Exam Updated Vital Signs BP (!) 175/92   Pulse 92   Temp 98 F (36.7 C) (Oral)   Resp 18   Ht 5\' 9"  (1.753 m)   Wt 74.8 kg (165 lb)   SpO2 96%   BMI 24.37 kg/m   Physical Exam  Constitutional: He appears well-developed and well-nourished.  HENT:  Head: Normocephalic and atraumatic.  Mouth/Throat: Mucous membranes are dry.  Eyes:  Conjunctivae and EOM are normal.  Neck: Normal range of motion.  Cardiovascular: Normal rate.   Pulmonary/Chest: Effort normal. No respiratory distress.  Abdominal: Soft. He exhibits no distension. There is tenderness (suprapubic, llq).  Musculoskeletal: Normal range of motion.  Neurological: He is alert.  Skin: Skin is warm and dry.  Nursing note and vitals reviewed.    ED Treatments / Results  Labs (all labs ordered are listed, but only abnormal results are displayed) Labs Reviewed  CBC WITH DIFFERENTIAL/PLATELET - Abnormal; Notable for the following:       Result Value   WBC 12.4 (*)    Neutro Abs 7.9 (*)    Monocytes Absolute 1.6 (*)    All other components within normal limits  COMPREHENSIVE METABOLIC PANEL - Abnormal; Notable for the following:    BUN 26 (*)    Creatinine, Ser 1.94 (*)    Calcium 8.8 (*)    Albumin 3.3 (*)    Total Bilirubin 1.7 (*)    GFR calc non Af Amer 28 (*)    GFR calc Af Amer 33 (*)    All other components within normal limits  URINALYSIS, ROUTINE W REFLEX MICROSCOPIC - Abnormal; Notable for the following:    APPearance CLOUDY (*)    Hgb urine dipstick LARGE (*)    Protein, ur 30 (*)    Nitrite POSITIVE (*)    Leukocytes, UA LARGE (*)    Bacteria, UA FEW (*)    All other components within normal limits  LIPASE, BLOOD    EKG  EKG Interpretation None       Radiology Dg Chest 1 View  Result Date: 02/17/2017 CLINICAL DATA:  Evaluate for pneumonia.  Cough EXAM: CHEST 1 VIEW COMPARISON:  10/26/2014 FINDINGS: Limited rotated chest. Normal heart size. Aortic tortuosity. Chronic linear densities at the left base consistent with atelectasis. No edema, effusion, or pneumothorax. No air bronchograms. IMPRESSION: 1. No acute finding.  No evidence of pneumonia. 2. Mild scarring at the left base. Electronically Signed   By: Monte Fantasia M.D.   On: 02/17/2017 14:00   Ct Abdomen Pelvis W Contrast  Result Date: 02/17/2017 CLINICAL DATA:  Abdominal  pain.  Diverticulitis suspected. EXAM: CT ABDOMEN AND PELVIS WITH CONTRAST TECHNIQUE: Multidetector CT imaging of the abdomen and pelvis was performed using the standard protocol following bolus administration of intravenous contrast. CONTRAST:  80mL ISOVUE-300 IOPAMIDOL (ISOVUE-300) INJECTION 61% COMPARISON:  None. FINDINGS: Lower chest:  No acute finding.  Coronary atherosclerosis. Hepatobiliary: Multiple small cystic appearing low densities in the liver.Cholelithiasis. No evidence of biliary obstruction or inflammation. There is a 2 mm calcification in line with the nondilated lower common bile duct. Pancreas: Generalized pancreas atrophy. No duct dilatation or acute inflammation. Spleen: Unremarkable. Adrenals/Urinary Tract: Negative adrenals. No hydronephrosis or stone. Symmetric renal atrophy. Renal cysts present. Circumferential bladder wall thickening with cellules compatible with chronic  outlet obstruction. Stomach/Bowel: Previous proximal colon resection. Formed stool throughout most of the colon with mild circumferential rectal wall thickening. No fat inflammation. Vascular/Lymphatic: No acute vascular abnormality. Diffuse atherosclerotic calcification of the aorta and its branches. Notable noncalcified plaque on the proximal SMA without suspected flow limiting stenosis. Narrow appearance of the celiac axis, likely combination of median arcuate ligament and atherosclerosis. No mass or adenopathy. Reproductive:Mild symmetric prostate enlargement. Question previous trans urethral prostate resection. Other: No ascites or pneumoperitoneum. Postoperative ventral abdominal wall laxity. Musculoskeletal: Spondyloarthropathy with diffuse spinal ankylosis. Prominent osteopenia in the lower lumbar spine and pelvis. Few sacroiliac joints. IMPRESSION: 1. 2 mm stone along the course of the lower common bile duct, without duct dilatation. Cholelithiasis. Please correlate with liver function tests. 2. Diffuse formed  stool in the remaining colon, correlate for constipation. There is mild circumferential thickening of low rectal wall but no fat inflammation to suggest a proctitis. 3. Changes of chronic bladder outlet obstruction. 4.  Aortic Atherosclerosis (ICD10-I70.0). 5. Spondyloarthropathy with diffuse ankylosis. Electronically Signed   By: Monte Fantasia M.D.   On: 02/17/2017 13:53   US Abdomen Limited Ruq  Result Date: 02/17/2017 CLINICAL DATA:  Abdominal pain 1-2 weeks. EXAM: ULTRASOUND ABDOMEN LIMITED RIGHT UPPER QUADRANT COMPARISON:  CT earlier today. FINDINGS: Gallbladder: Evidence of cholelithiasis with largest stone measuring 1.6 cm. Borderline gallbladder wall thickening measuring 3.1 mm. No pericholecystic fluid. Negative sonographic Murphy sign. Common bile duct: Diameter: 4 mm.  No definite ductal stones. Liver: 2.4 cm cyst over the right lobe. Minimal central right intrahepatic ductal dilatation. 4.3 cm cyst over the upper pole right kidney. IMPRESSION: Moderate cholelithiasis with borderline gallbladder wall thickening. Negative sonographic Murphy sign. No definite evidence of common bile duct stone and no evidence of common bile duct dilatation. 2.4 cm right liver cyst. 4.3 cm right renal cyst. Electronically Signed   By: Marin Olp M.D.   On: 02/17/2017 16:02    Procedures Procedures (including critical care time)  Medications Ordered in ED Medications  heparin injection 5,000 Units (not administered)  0.9 %  sodium chloride infusion (not administered)  ondansetron (ZOFRAN) tablet 4 mg (not administered)    Or  ondansetron (ZOFRAN) injection 4 mg (not administered)  acetaminophen (TYLENOL) tablet 650 mg (not administered)    Or  acetaminophen (TYLENOL) suppository 650 mg (not administered)  cefTRIAXone (ROCEPHIN) 1 g in dextrose 5 % 50 mL IVPB (not administered)  polyethylene glycol (MIRALAX / GLYCOLAX) packet 17 g (not administered)  sodium chloride 0.9 % bolus 1,000 mL (0 mLs  Intravenous Stopped 02/17/17 1503)  iopamidol (ISOVUE-300) 61 % injection 75 mL (75 mLs Intravenous Contrast Given 02/17/17 1339)  cefTRIAXone (ROCEPHIN) 1 g in dextrose 5 % 50 mL IVPB (1 g Intravenous New Bag/Given 02/17/17 1503)     Initial Impression / Assessment and Plan / ED Course  I have reviewed the triage vital signs and the nursing notes.  Pertinent labs & imaging results that were available during my care of the patient were reviewed by me and considered in my medical decision making (see chart for details).     UTI likely causing dehydration and weakness. Will d/w hospitalist regarding admission.   Final Clinical Impressions(s) / ED Diagnoses   Final diagnoses:  Cholelithiasis  Urinary tract infection without hematuria, site unspecified      Darneisha Windhorst, Corene Cornea, MD 02/17/17 [phone]
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
## 2017-02-17 NOTE — ED Notes (Signed)
Went in to round on pt. Pt's HR found to be in the 130's, BP 201/110, oral temp 100.6, resp 23. Pt reports he is "really worried about everything". Dr. Ernestina Patches paged and made aware of pt's abnormal vitals. MD reported he would place orders. Will continue to monitor.

## 2017-02-17 NOTE — ED Notes (Signed)
Pt's HR noted to be between 110-120. Family at bedside and reporting that pt is upset about being admitted to the hospital. Attempted to reassure and calm pt. Pt appears slightly anxious. Family denies any use of anxiety medications at home. Will continue to monitor.

## 2017-02-17 NOTE — ED Notes (Signed)
IV attempted,unsuccessful. Pt refusing further attempts. EDP notified. Attempted to call wife, Lesleigh Noe, with the number listed in pt's chart, no answer, no voicemail. Pt unaware of any family member's phone numbers. EDP notified.

## 2017-02-17 NOTE — ED Notes (Signed)
I&O cath attempted. Resistance felt, unable to pass.   Coude catheter attempted. Resistance continued to be felt, unable to pass. Urethral bleeding present.   EDP notified.

## 2017-02-17 NOTE — ED Triage Notes (Signed)
Pt brought in from home by [facility] with c/o not feeling well x 1.5 weeks, lower abdominal pain, pressure with urination per EMS report. Pt denies lower abdominal pain and pressure with urination upon arrival to ED. Pt resides in hospital bed at home with wife as caregiver. Denies fever. Reports weakness, but states this is his normal.

## 2017-02-17 NOTE — ED Notes (Signed)
Attempted cath (both regular and coude) both attempts unsuccessful due to enlarged prostate.  Dr. Sherlynn Stalls and [place]

## 2017-02-18 ENCOUNTER — Encounter (HOSPITAL_COMMUNITY): Payer: Self-pay | Admitting: Internal Medicine

## 2017-02-18 DIAGNOSIS — K8050 Calculus of bile duct without cholangitis or cholecystitis without obstruction: Secondary | ICD-10-CM | POA: Diagnosis not present

## 2017-02-18 DIAGNOSIS — N184 Chronic kidney disease, stage 4 (severe): Secondary | ICD-10-CM

## 2017-02-18 DIAGNOSIS — R319 Hematuria, unspecified: Secondary | ICD-10-CM | POA: Diagnosis not present

## 2017-02-18 DIAGNOSIS — N390 Urinary tract infection, site not specified: Secondary | ICD-10-CM | POA: Diagnosis not present

## 2017-02-18 DIAGNOSIS — N319 Neuromuscular dysfunction of bladder, unspecified: Secondary | ICD-10-CM

## 2017-02-18 DIAGNOSIS — Q453 Other congenital malformations of pancreas and pancreatic duct: Secondary | ICD-10-CM

## 2017-02-18 DIAGNOSIS — K5909 Other constipation: Secondary | ICD-10-CM

## 2017-02-18 DIAGNOSIS — I10 Essential (primary) hypertension: Secondary | ICD-10-CM | POA: Diagnosis present

## 2017-02-18 DIAGNOSIS — G8250 Quadriplegia, unspecified: Secondary | ICD-10-CM | POA: Diagnosis not present

## 2017-02-18 DIAGNOSIS — R935 Abnormal findings on diagnostic imaging of other abdominal regions, including retroperitoneum: Secondary | ICD-10-CM | POA: Diagnosis present

## 2017-02-18 DIAGNOSIS — K8020 Calculus of gallbladder without cholecystitis without obstruction: Secondary | ICD-10-CM | POA: Diagnosis not present

## 2017-02-18 DIAGNOSIS — K5900 Constipation, unspecified: Secondary | ICD-10-CM | POA: Diagnosis present

## 2017-02-18 DIAGNOSIS — L8990 Pressure ulcer of unspecified site, unspecified stage: Secondary | ICD-10-CM | POA: Diagnosis present

## 2017-02-18 HISTORY — DX: Calculus of bile duct without cholangitis or cholecystitis without obstruction: K80.50

## 2017-02-18 HISTORY — DX: Other congenital malformations of pancreas and pancreatic duct: Q45.3

## 2017-02-18 LAB — COMPREHENSIVE METABOLIC PANEL
ALK PHOS: 105 U/L (ref 38–126)
ALT: 20 U/L (ref 17–63)
AST: 23 U/L (ref 15–41)
Albumin: 2.8 g/dL — ABNORMAL LOW (ref 3.5–5.0)
Anion gap: 9 (ref 5–15)
BUN: 29 mg/dL — AB (ref 6–20)
CALCIUM: 7.9 mg/dL — AB (ref 8.9–10.3)
CO2: 21 mmol/L — AB (ref 22–32)
CREATININE: 1.98 mg/dL — AB (ref 0.61–1.24)
Chloride: 106 mmol/L (ref 101–111)
GFR, EST AFRICAN AMERICAN: 32 mL/min — AB (ref >60)
GFR, EST NON AFRICAN AMERICAN: 28 mL/min — AB (ref >60)
Glucose, Bld: 114 mg/dL — ABNORMAL HIGH (ref 65–99)
Potassium: 4.2 mmol/L (ref 3.5–5.1)
Sodium: 136 mmol/L (ref 135–145)
Total Bilirubin: 1.4 mg/dL — ABNORMAL HIGH (ref 0.3–1.2)
Total Protein: 6.6 g/dL (ref 6.5–8.1)

## 2017-02-18 LAB — HEPATIC FUNCTION PANEL
ALK PHOS: 103 U/L (ref 38–126)
ALT: 19 U/L (ref 17–63)
AST: 22 U/L (ref 15–41)
Albumin: 2.8 g/dL — ABNORMAL LOW (ref 3.5–5.0)
BILIRUBIN DIRECT: 0.3 mg/dL (ref 0.1–0.5)
BILIRUBIN INDIRECT: 1.1 mg/dL — AB (ref 0.3–0.9)
TOTAL PROTEIN: 6.5 g/dL (ref 6.5–8.1)
Total Bilirubin: 1.4 mg/dL — ABNORMAL HIGH (ref 0.3–1.2)

## 2017-02-18 LAB — BILIRUBIN, DIRECT: Bilirubin, Direct: 0.3 mg/dL (ref 0.1–0.5)

## 2017-02-18 MED ORDER — MILK AND MOLASSES ENEMA: 1.0000 | Freq: Once | RECTAL | Status: DC

## 2017-02-18 MED ORDER — MILK AND MOLASSES ENEMA
1.0000 | Freq: Once | RECTAL | Status: AC
  Administered 2017-02-18: 250 mL via RECTAL

## 2017-02-18 MED ORDER — HEPARIN SODIUM (PORCINE) 5000 UNIT/ML IJ SOLN
5000.0000 [IU] | Freq: Three times a day (TID) | INTRAMUSCULAR | Status: DC
  Administered 2017-02-18 – 2017-02-19 (×3): 5000 [IU] via SUBCUTANEOUS
  Filled 2017-02-18 (×5): qty 1
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
## 2017-02-18 NOTE — Care Management Obs Status (Signed)
[facility] NOTIFICATION   Patient Details  Name: Christopher Mcdonald MRN: [id number] Date of Birth: Jan 18, 1923   Medicare Observation Status Notification Given:  Yes    Sherald Barge, RN 02/18/2017, 1:22 PM
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
## 2017-02-18 NOTE — Care Management Note (Signed)
Case Management Note  Patient Details  Name: Christopher Mcdonald MRN: [id number] Date of Birth: 1923-05-08  Subjective/Objective:                  Admitted with UTI. Pt is from home, lives with wife. Pt is WC bound. Has PD aid that comes in twice a day to help get pt into WC in the AM and out of WC at night. He moves around the home ind in [facility]. He is active with [facility] with RN and PT services. Pt requests aid services be added at DC.   Action/Plan: Judson Roch, [facility] rep, aware of admission and DC plan. Pt will need order to resume services at DC. [facility] will need to be notified if pt discharges over weekend. [phone]  Expected Discharge Date:     02/20/2017             Expected Discharge Plan:  [place]  In-House Referral:  NA  Discharge planning Services  CM Consult  Post Acute Care Choice:  Home Health, Resumption of Svcs/PTA Provider Choice offered to:  Patient, Spouse  HH Arranged:  RN, PT, Nurse's Aide [place] Agency:  [place]  Status of Service:  In process, will continue to follow   Sherald Barge, RN 02/18/2017, 2:21 PM

## 2017-02-19 DIAGNOSIS — K5900 Constipation, unspecified: Secondary | ICD-10-CM | POA: Diagnosis not present

## 2017-02-19 DIAGNOSIS — K8020 Calculus of gallbladder without cholecystitis without obstruction: Secondary | ICD-10-CM | POA: Diagnosis not present

## 2017-02-19 DIAGNOSIS — Q453 Other congenital malformations of pancreas and pancreatic duct: Secondary | ICD-10-CM

## 2017-02-19 DIAGNOSIS — N390 Urinary tract infection, site not specified: Secondary | ICD-10-CM | POA: Diagnosis present

## 2017-02-19 DIAGNOSIS — N184 Chronic kidney disease, stage 4 (severe): Secondary | ICD-10-CM | POA: Diagnosis not present

## 2017-02-19 DIAGNOSIS — K8050 Calculus of bile duct without cholangitis or cholecystitis without obstruction: Secondary | ICD-10-CM | POA: Diagnosis not present

## 2017-02-19 LAB — BASIC METABOLIC PANEL
Anion gap: 14 (ref 5–15)
BUN: 25 mg/dL — AB (ref 6–20)
CHLORIDE: 107 mmol/L (ref 101–111)
CO2: 20 mmol/L — ABNORMAL LOW (ref 22–32)
Calcium: 8.5 mg/dL — ABNORMAL LOW (ref 8.9–10.3)
Creatinine, Ser: 1.79 mg/dL — ABNORMAL HIGH (ref 0.61–1.24)
GFR calc Af Amer: 36 mL/min — ABNORMAL LOW (ref >60)
GFR calc non Af Amer: 31 mL/min — ABNORMAL LOW (ref >60)
GLUCOSE: 82 mg/dL (ref 65–99)
POTASSIUM: 4.1 mmol/L (ref 3.5–5.1)
Sodium: 141 mmol/L (ref 135–145)

## 2017-02-19 LAB — CBC
HCT: 38.5 % — ABNORMAL LOW (ref 39.0–52.0)
HEMOGLOBIN: 12.8 g/dL — AB (ref 13.0–17.0)
MCH: 30.3 pg (ref 26.0–34.0)
MCHC: 33.2 g/dL (ref 30.0–36.0)
MCV: 91.2 fL (ref 78.0–100.0)
Platelets: 213 10*3/uL (ref 150–400)
RBC: 4.22 MIL/uL (ref 4.22–5.81)
RDW: 14 % (ref 11.5–15.5)
WBC: 14.7 10*3/uL — ABNORMAL HIGH (ref 4.0–10.5)

## 2017-02-19 LAB — HEPATIC FUNCTION PANEL
ALBUMIN: 3.2 g/dL — AB (ref 3.5–5.0)
ALK PHOS: 110 U/L (ref 38–126)
ALT: 24 U/L (ref 17–63)
AST: 35 U/L (ref 15–41)
BILIRUBIN INDIRECT: 1.1 mg/dL — AB (ref 0.3–0.9)
BILIRUBIN TOTAL: 1.3 mg/dL — AB (ref 0.3–1.2)
Bilirubin, Direct: 0.2 mg/dL (ref 0.1–0.5)
TOTAL PROTEIN: 7.4 g/dL (ref 6.5–8.1)

## 2017-02-19 MED ORDER — HYDRALAZINE HCL 20 MG/ML IJ SOLN
5.0000 mg | Freq: Once | INTRAMUSCULAR | Status: AC
  Administered 2017-02-20: 5 mg via INTRAVENOUS
  Filled 2017-02-19: qty 1

## 2017-02-19 MED ORDER — VANCOMYCIN HCL IN DEXTROSE 1-5 GM/200ML-% IV SOLN
1000.0000 mg | Freq: Once | INTRAVENOUS | Status: AC
  Administered 2017-02-19: 1000 mg via INTRAVENOUS
  Filled 2017-02-19: qty 200

## 2017-02-19 MED ORDER — VANCOMYCIN HCL IN DEXTROSE 750-5 MG/150ML-% IV SOLN
750.0000 mg | INTRAVENOUS | Status: DC
  Filled 2017-02-19 (×2): qty 150

## 2017-02-19 MED ORDER — CEFTRIAXONE SODIUM 1 G IJ SOLR
1.0000 g | INTRAMUSCULAR | Status: DC
  Administered 2017-02-19 – 2017-02-20 (×2): 1 g via INTRAVENOUS
  Filled 2017-02-19 (×3): qty 10

## 2017-02-19 NOTE — Progress Notes (Signed)
Pharmacy Antibiotic Note  Christopher MCCLANAHAN is a 83 y.o. male admitted on 02/17/2017 with h/o enterococcal UTI.  Pharmacy has been consulted for VANCOMYCIN dosing.  Plan: Vancomycin 1000mg  x 1 then 750mg  IV q24hrs (renally adjusted) Check trough at steady state Monitor labs, progress, c/s  Height: 5\' 9"  (175.3 cm) Weight: 165 lb (74.8 kg) IBW/kg (Calculated) : 70.7  Temp (24hrs), Avg:98.3 F (36.8 C), Min:98.2 F (36.8 C), Max:98.5 F (36.9 C)   Recent Labs Lab 02/17/17 1140 02/18/17 0923 02/19/17 0640  WBC 12.4*  --  14.7*  CREATININE 1.94* 1.98* 1.79*    Estimated Creatinine Clearance: 26.3 mL/min (A) (by C-G formula based on SCr of 1.79 mg/dL (H)).    Allergies  Allergen Reactions  . Diazepam     REACTION: Not sure  . Sulfamethoxazole-Trimethoprim     REACTION: Nausea   Antimicrobials this admission: Vanc 8/11 >>  Rocephin 8/11 >>   Dose adjustments this admission:  Microbiology results:  BCx:   UCx: pendingt   Sputum:    MRSA PCR:   Thank you for allowing pharmacy to be a part of this patient's care.  Hart Robinsons A 02/19/2017 9:51 AM
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
## 2017-02-19 NOTE — Progress Notes (Signed)
  Subjective:  Patient has no complaints. He denies abdominal pain nausea or vomiting. According to nursing staff he had good results with milk of molasses enema he was given yesterday afternoon.  Objective: Blood pressure 128/68, pulse 97, temperature 98.2 F (36.8 C), temperature source Oral, resp. rate 18, height 5\' 9"  (1.753 m), weight 165 lb (74.8 kg), SpO2 97 %. Patient is alert and in no acute distress. . Abdomen is symmetrical soft and nontender. No organomegaly or masses. He has spastic upper extremities.  Labs/studies Results:   Recent Labs  02/17/17 1140 02/19/17 0640  WBC 12.4* 14.7*  HGB 14.2 12.8*  HCT 42.1 38.5*  PLT 169 213    BMET   Recent Labs  02/17/17 1140 02/18/17 0923 02/19/17 0640  NA 135 136 141  K 4.4 4.2 4.1  CL 102 106 107  CO2 25 21* 20*  GLUCOSE 90 114* 82  BUN 26* 29* 25*  CREATININE 1.94* 1.98* 1.79*  CALCIUM 8.8* 7.9* 8.5*    LFT   Recent Labs  02/17/17 1140 02/18/17 0923 02/19/17 0640  PROT 7.5 6.5  6.6 7.4  ALBUMIN 3.3* 2.8*  2.8* 3.2*  AST 19 22  23  35  ALT 24 19  20 24   ALKPHOS 89 103  105 110  BILITOT 1.7* 1.4*  1.4* 1.3*  BILIDIR  --  0.3  0.3 0.2  IBILI  --  1.1* 1.1*      Assessment:  #1.Choledocholithiasis. He has single small stone in distal CBD. Transaminases are normal. Mild hyperbilirubinemia is indirect and therefore not indicated of of cholestasis. It is quite possible that he will pass the stone spontaneously. Will hold off ERCP unless patient has biliary colic or transaminases rise.  #2. Asymptomatic cholelithiasis.  #3. Constipation/fecal impaction. Seemed to have responded to milk of molasses. He is back on polyethylene glycol.  #4. UTI. Patient is on IV vancomycin while cultures are pending. He had enterococcal UTI in 2016.   Recommendations:  Continue polyethylene glycol. LFTs prior to discharge.

## 2017-02-20 DIAGNOSIS — N3000 Acute cystitis without hematuria: Secondary | ICD-10-CM | POA: Diagnosis not present

## 2017-02-20 LAB — URINE CULTURE

## 2017-02-20 LAB — BASIC METABOLIC PANEL
Anion gap: 9 (ref 5–15)
BUN: 21 mg/dL — AB (ref 6–20)
CALCIUM: 8.4 mg/dL — AB (ref 8.9–10.3)
CO2: 21 mmol/L — ABNORMAL LOW (ref 22–32)
CREATININE: 1.59 mg/dL — AB (ref 0.61–1.24)
Chloride: 107 mmol/L (ref 101–111)
GFR calc Af Amer: 42 mL/min — ABNORMAL LOW (ref >60)
GFR, EST NON AFRICAN AMERICAN: 36 mL/min — AB (ref >60)
GLUCOSE: 90 mg/dL (ref 65–99)
Potassium: 4 mmol/L (ref 3.5–5.1)
SODIUM: 137 mmol/L (ref 135–145)

## 2017-02-20 LAB — CBC
HEMATOCRIT: 37.1 % — AB (ref 39.0–52.0)
HEMOGLOBIN: 12.7 g/dL — AB (ref 13.0–17.0)
MCH: 30.9 pg (ref 26.0–34.0)
MCHC: 34.2 g/dL (ref 30.0–36.0)
MCV: 90.3 fL (ref 78.0–100.0)
PLATELETS: 237 10*3/uL (ref 150–400)
RBC: 4.11 MIL/uL — AB (ref 4.22–5.81)
RDW: 13.7 % (ref 11.5–15.5)
WBC: 10.4 10*3/uL (ref 4.0–10.5)

## 2017-02-20 MED ORDER — TAMSULOSIN HCL 0.4 MG PO CAPS
0.4000 mg | ORAL_CAPSULE | Freq: Every day | ORAL | Status: DC
  Administered 2017-02-20 – 2017-02-21 (×2): 0.4 mg via ORAL
  Filled 2017-02-20 (×2): qty 1
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
## 2017-02-20 NOTE — Progress Notes (Signed)
Patients blood pressure @ 177/92 mid level K [place] paged order given to give hydralazine 5 mg IV

## 2017-02-21 ENCOUNTER — Telehealth: Payer: Self-pay | Admitting: Gastroenterology

## 2017-02-21 DIAGNOSIS — N3000 Acute cystitis without hematuria: Secondary | ICD-10-CM | POA: Diagnosis not present

## 2017-02-21 DIAGNOSIS — G8250 Quadriplegia, unspecified: Secondary | ICD-10-CM

## 2017-02-21 DIAGNOSIS — K5900 Constipation, unspecified: Secondary | ICD-10-CM | POA: Diagnosis not present

## 2017-02-21 DIAGNOSIS — R1032 Left lower quadrant pain: Secondary | ICD-10-CM

## 2017-02-21 DIAGNOSIS — K8020 Calculus of gallbladder without cholecystitis without obstruction: Secondary | ICD-10-CM | POA: Diagnosis not present

## 2017-02-21 DIAGNOSIS — N183 Chronic kidney disease, stage 3 (moderate): Secondary | ICD-10-CM

## 2017-02-21 DIAGNOSIS — K8050 Calculus of bile duct without cholangitis or cholecystitis without obstruction: Secondary | ICD-10-CM | POA: Diagnosis not present

## 2017-02-21 LAB — CBC WITH DIFFERENTIAL/PLATELET
BASOS ABS: 0 10*3/uL (ref 0.0–0.1)
BASOS PCT: 0 %
EOS ABS: 0.1 10*3/uL (ref 0.0–0.7)
EOS PCT: 2 %
HCT: 36.3 % — ABNORMAL LOW (ref 39.0–52.0)
Hemoglobin: 12.5 g/dL — ABNORMAL LOW (ref 13.0–17.0)
Lymphocytes Relative: 19 %
Lymphs Abs: 1.8 10*3/uL (ref 0.7–4.0)
MCH: 31 pg (ref 26.0–34.0)
MCHC: 34.4 g/dL (ref 30.0–36.0)
MCV: 90.1 fL (ref 78.0–100.0)
MONO ABS: 0.8 10*3/uL (ref 0.1–1.0)
MONOS PCT: 8 %
NEUTROS ABS: 6.6 10*3/uL (ref 1.7–7.7)
Neutrophils Relative %: 71 %
PLATELETS: 249 10*3/uL (ref 150–400)
RBC: 4.03 MIL/uL — ABNORMAL LOW (ref 4.22–5.81)
RDW: 13.5 % (ref 11.5–15.5)
WBC: 9.3 10*3/uL (ref 4.0–10.5)

## 2017-02-21 LAB — COMPREHENSIVE METABOLIC PANEL
ALBUMIN: 2.8 g/dL — AB (ref 3.5–5.0)
ALT: 21 U/L (ref 17–63)
ANION GAP: 11 (ref 5–15)
AST: 23 U/L (ref 15–41)
Alkaline Phosphatase: 75 U/L (ref 38–126)
BILIRUBIN TOTAL: 1 mg/dL (ref 0.3–1.2)
BUN: 19 mg/dL (ref 6–20)
CHLORIDE: 105 mmol/L (ref 101–111)
CO2: 22 mmol/L (ref 22–32)
Calcium: 8.4 mg/dL — ABNORMAL LOW (ref 8.9–10.3)
Creatinine, Ser: 1.56 mg/dL — ABNORMAL HIGH (ref 0.61–1.24)
GFR calc Af Amer: 43 mL/min — ABNORMAL LOW (ref >60)
GFR calc non Af Amer: 37 mL/min — ABNORMAL LOW (ref >60)
GLUCOSE: 81 mg/dL (ref 65–99)
POTASSIUM: 3.8 mmol/L (ref 3.5–5.1)
SODIUM: 138 mmol/L (ref 135–145)
TOTAL PROTEIN: 6.6 g/dL (ref 6.5–8.1)

## 2017-02-21 MED ORDER — POLYETHYLENE GLYCOL 3350 17 G PO PACK: 17.0000 g | PACK | Freq: Two times a day (BID) | ORAL | Status: AC

## 2017-02-21 NOTE — Telephone Encounter (Signed)
Please arrange a follow-up appointment with [place] in 4-6 weeks for hospital follow-up.

## 2017-02-21 NOTE — Progress Notes (Signed)
Patient being d/c home with wife via EMS. Wife called and notified that patient being discharged home. IV cath removed and intact. No c/o pain at this time or at site. Will call wife and update when EMS arrive and leave with patient.

## 2017-02-21 NOTE — Care Management Obs Status (Signed)
[place] NOTIFICATION   Patient Details  Name: Christopher Mcdonald MRN: [id number] Date of Birth: 09/14/1922   Medicare Observation Status Notification Given:  Yes    Sherald Barge, RN 02/21/2017, 1:50 PM

## 2017-02-21 NOTE — Discharge Summary (Signed)
Physician Discharge Summary  Christopher Mcdonald OJJ:[id number] DOB: 09-26-1922 DOA: 02/17/2017  PCP: Iona Beard, MD  Admit date: 02/17/2017 Discharge date: 02/21/2017  Recommendations for Outpatient Follow-up:  Follow-up rectal exam as an outpatient   Follow-up [facility] Follow up.   Specialty:  Home Health Services Contact information: [place] [address] [phone]        Iona Beard, MD Follow up.   Specialty:  Family Medicine Why:  as needed Contact information: [address] [place] [address] [phone]            Discharge Diagnoses:  1. UTI with hematuria complicated by neurogenic bladder 2. Choledocholithiasis 3. Left lower quadrant pain secondary to fecal impaction, constipation 4. Asymptomatic cholelithiasis 5. Essential hypertension 6. Chronic kidney disease stage III 7. 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 Aortic atherosclerosis  Discharge Condition: improved  Disposition: home  Diet recommendation: heart healthy  Filed Weights   02/17/17 1123  Weight: 74.8 kg (165 lb)    History of present illness:  83 year old man PMH quadriplegia, neurogenic bladder presented with lower abdominal pain, malaise for 2 weeks. No nausea, vomiting, fever or chills. History of UTIs. Admitted for UTI, left lower quadrant pain, cholelithiasis.  Hospital Course:  Patient was treated with empiric antibiotics for UTI. No culture was obtained. He remained afebrile and symptoms of abdominal pain resolved. Imaging suggested choledocholithiasis is seen by gastroenterology, MRCP was obtained which again suggested choledocholithiasis. However patient's LFTs did not correlate with imaging findings and the patient remained asymptomatic and therefore GI recommended against ERCP. On the day of discharge LFTs were unremarkable consistent with suspected passed stone. No further evaluation suggested in the outpatient setting.  Left lower quadrant pain thought secondary to fecal impaction and constipation which resolved after 2 large bowel movements. Bowel regimen with MiraLAX twice daily was recommended. Presumably rectal wall thickening with secondary to impaction, constipation. Follow-up rectal exam is recommended as an outpatient. Comorbidities including chronic kidney disease stage III and quadriplegia secondary to trauma remained stable.  UTI with hematuria, complicated by neurogenic bladder -No culture ordered on admission. Treated with empiric antibiotics.  Left lower quadrant pain -Thought secondary to fecal impaction, constipation. -Resolved after 2 large bowel movements -Continue bowel regimen as below  Choledocholithiasis cholelithiasis. The abdominal pelvic CT abdominal ultrasound showed common bile duct stone. Bilirubin was slightly elevated liver transaminases were unremarkable. MRA of the abdomen again showed choledochal cholelithiasis. GI evaluated the patient, recommended against ERCP has patient had no biliary symptoms. -Supportive care  Asymptomatic cholelithiasis  Hypertension  Rectal wall thickening likely secondary to impaction. 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 Breathing fine. O: Vitals: Afebrile, 98.1, 20, 62, 177/89, 97% on room air.    Constitutional. Appears calm, comfortable.   Cardiovascular. Regular rate and rhythm. No murmur, rub or gallop. No lower extremity edema.  Respiratory. Clear to auscultation bilaterally. No wheezes, rales or rhonchi. Normal respiratory effort.  Psychiatric. Grossly normal mood and affect. Speech  fluent and appropriate.  have personally reviewed the following:   Labs:  Basic metabolic panel unremarkable, LFTs unremarkable. Creatinine at baseline. Total bilirubin within normal limits.  CBC  unremarkable, WBC 9.3. Hemoglobin stable 12.5. Platelets within normal limits.  Discharge Instructions  Discharge Instructions    Discharge instructions    Complete by:  As directed    Call your physician or seek immediate medical attention for fever, abdominal pain, vomiting, shortness of breath, confusion or worsening of condition     Allergies as of 02/21/2017      Reactions   Diazepam    REACTION: Not sure   Sulfamethoxazole-trimethoprim    REACTION: Nausea      Medication List    STOP taking these medications   colestipol 1 g tablet Commonly known as:  COLESTID     TAKE these medications   baclofen 10 MG tablet Commonly known as:  LIORESAL Take 5 mg by mouth 2 (two) times daily.   CLEAR EYES FOR DRY EYES OP Apply 1 drop to eye daily.   cloNIDine 0.1 mg/24hr patch Commonly known as:  CATAPRES - Dosed in mg/24 hr Place 0.1 mg onto the skin once a week. WEDNESDAY   folic acid 1 MG tablet Commonly known as:  FOLVITE Take 1 mg by mouth daily.   meclizine 12.5 MG tablet Commonly known as:  ANTIVERT Take 12.5 mg by mouth 3 (three) times daily as needed for dizziness.   polyethylene glycol packet Commonly known as:  MIRALAX / GLYCOLAX Take 17 g by mouth 2 (two) times daily.      Allergies  Allergen Reactions  . Diazepam     REACTION: Not sure  . Sulfamethoxazole-Trimethoprim     REACTION: Nausea    The results of significant diagnostics from this hospitalization (including imaging, microbiology, ancillary and laboratory) are listed below for reference.    Significant Diagnostic Studies: Dg Chest 1 View  Result Date: 02/17/2017 CLINICAL DATA:  Evaluate for pneumonia.  Cough EXAM: CHEST 1 VIEW COMPARISON:  10/26/2014 FINDINGS: Limited rotated chest. Normal  heart size. Aortic tortuosity. Chronic linear densities at the left base consistent with atelectasis. No edema, effusion, or pneumothorax. No air bronchograms. IMPRESSION: 1. No acute finding.  No evidence of pneumonia. 2. Mild scarring at the left base. Electronically Signed   By: Monte Fantasia M.D.   On: 02/17/2017 14:00   Ct Abdomen Pelvis W Contrast  Result Date: 02/17/2017 CLINICAL DATA:  Abdominal pain.  Diverticulitis suspected. EXAM: CT ABDOMEN AND PELVIS WITH CONTRAST TECHNIQUE: Multidetector CT imaging of the abdomen and pelvis was performed using the standard protocol following bolus administration of intravenous contrast. CONTRAST:  57mL ISOVUE-300 IOPAMIDOL (ISOVUE-300) INJECTION 61% COMPARISON:  None. FINDINGS: Lower chest:  No acute finding.  Coronary atherosclerosis. Hepatobiliary: Multiple small cystic appearing low densities in the liver.Cholelithiasis. No evidence of biliary obstruction or inflammation. There is a 2 mm calcification in line with the nondilated lower common bile duct. Pancreas: Generalized pancreas atrophy. No duct dilatation or acute inflammation. Spleen: Unremarkable. Adrenals/Urinary Tract: Negative adrenals. No hydronephrosis or stone. Symmetric renal atrophy. Renal cysts present. Circumferential bladder wall thickening with cellules compatible with chronic outlet obstruction. Stomach/Bowel: Previous proximal colon resection. Formed stool throughout most of the colon with mild circumferential rectal wall thickening. No fat inflammation. Vascular/Lymphatic: No acute vascular abnormality. Diffuse atherosclerotic calcification of the aorta and its branches. Notable noncalcified plaque on the proximal SMA without suspected flow limiting stenosis. Narrow appearance of the celiac axis, likely combination of median arcuate ligament and atherosclerosis. No mass or adenopathy. Reproductive:Mild symmetric prostate enlargement. Question previous trans urethral prostate resection.  Other: No ascites or pneumoperitoneum. Postoperative ventral abdominal wall laxity.  Musculoskeletal: Spondyloarthropathy with diffuse spinal ankylosis. Prominent osteopenia in the lower lumbar spine and pelvis. Few sacroiliac joints. IMPRESSION: 1. 2 mm stone along the course of the lower common bile duct, without duct dilatation. Cholelithiasis. Please correlate with liver function tests. 2. Diffuse formed stool in the remaining colon, correlate for constipation. There is mild circumferential thickening of low rectal wall but no fat inflammation to suggest a proctitis. 3. Changes of chronic bladder outlet obstruction. 4.  Aortic Atherosclerosis (ICD10-I70.0). 5. Spondyloarthropathy with diffuse ankylosis. Electronically Signed   By: Monte Fantasia M.D.   On: 02/17/2017 13:53   Mr Abdomen Mrcp Wo Contrast  Result Date: 02/18/2017 CLINICAL DATA:  Cholelithiasis. Abnormal liver function tests. Inpatient. EXAM: MRI ABDOMEN WITHOUT CONTRAST  (INCLUDING MRCP) TECHNIQUE: Multiplanar multisequence MR imaging of the abdomen was performed. Heavily T2-weighted images of the biliary and pancreatic ducts were obtained, and three-dimensional MRCP images were rendered by post processing. COMPARISON:  02/17/2017 CT abdomen/ pelvis and abdominal sonogram. FINDINGS: Lower chest: Mild scarring versus atelectasis in the dependent basilar lung bases. Hepatobiliary: Normal liver size and configuration. Mild diffuse hepatic steatosis. There are numerous small circumscribed T2 hyperintense lesions scattered throughout the liver measuring up to 2.3 cm in the inferior right liver lobe, incompletely characterized, probably benign liver cysts. There are a few gallstones layering in the gallbladder measuring up to 1.1 cm in size. No significant gallbladder distention, gallbladder wall thickening or pericholecystic fluid. No biliary ductal dilatation. Common bile duct diameter 5 mm. There is a solitary 2 mm choledocholith in the lower  third of the common bile duct near the ampulla. No additional filling defects in the biliary tree. No biliary strictures. Pancreas: No pancreatic mass or duct dilation. There is pancreas divisum, with drainage of the normal caliber main pancreatic duct via an accessory duct of [place], with no evidence of communication of the main pancreatic duct with the common bile duct. Spleen: Normal size. No mass. Adrenals/Urinary Tract: Normal adrenals. No hydronephrosis. There a few simple appearing renal cysts in both kidneys, largest 4.9 cm in the upper right kidney. Stomach/Bowel: Grossly normal stomach. Visualized small and large bowel is normal caliber, with no bowel wall thickening. Mild left colonic diverticulosis. Vascular/Lymphatic: Atherosclerotic nonaneurysmal abdominal aorta. No pathologically enlarged lymph nodes in the abdomen. Other: No abdominal ascites or focal fluid collection. Musculoskeletal: No aggressive appearing focal osseous lesions. IMPRESSION: 1. Solitary 2 mm choledocholith in the lower third of the common bile duct near the ampulla. No biliary ductal dilatation. Common bile duct diameter 5 mm. 2. Cholelithiasis.  No evidence of acute cholecystitis. 3. The pancreas divisum. 4. Benign-appearing cysts in the liver and kidneys. 5. Mild left colonic diverticulosis. 6.  Aortic Atherosclerosis (ICD10-I70.0). Electronically Signed   By: Ilona Sorrel M.D.   On: 02/18/2017 08:23   Mr 3d Recon At Scanner  Result Date: 02/18/2017 CLINICAL DATA:  Cholelithiasis. Abnormal liver function tests. Inpatient. EXAM: MRI ABDOMEN WITHOUT CONTRAST  (INCLUDING MRCP) TECHNIQUE: Multiplanar multisequence MR imaging of the abdomen was performed. Heavily T2-weighted images of the biliary and pancreatic ducts were obtained, and three-dimensional MRCP images were rendered by post processing. COMPARISON:  02/17/2017 CT abdomen/ pelvis and abdominal sonogram. FINDINGS: Lower chest: Mild scarring versus atelectasis in the  dependent basilar lung bases. Hepatobiliary: Normal liver size and configuration. Mild diffuse hepatic steatosis. There are numerous small circumscribed T2 hyperintense lesions scattered throughout the liver measuring up to 2.3 cm in the inferior right liver lobe, incompletely characterized, probably benign liver cysts. There are  a few gallstones layering in the gallbladder measuring up to 1.1 cm in size. No significant gallbladder distention, gallbladder wall thickening or pericholecystic fluid. No biliary ductal dilatation. Common bile duct diameter 5 mm. There is a solitary 2 mm choledocholith in the lower third of the common bile duct near the ampulla. No additional filling defects in the biliary tree. No biliary strictures. Pancreas: No pancreatic mass or duct dilation. There is pancreas divisum, with drainage of the normal caliber main pancreatic duct via an accessory duct of [place], with no evidence of communication of the main pancreatic duct with the common bile duct. Spleen: Normal size. No mass. Adrenals/Urinary Tract: Normal adrenals. No hydronephrosis. There a few simple appearing renal cysts in both kidneys, largest 4.9 cm in the upper right kidney. Stomach/Bowel: Grossly normal stomach. Visualized small and large bowel is normal caliber, with no bowel wall thickening. Mild left colonic diverticulosis. Vascular/Lymphatic: Atherosclerotic nonaneurysmal abdominal aorta. No pathologically enlarged lymph nodes in the abdomen. Other: No abdominal ascites or focal fluid collection. Musculoskeletal: No aggressive appearing focal osseous lesions. IMPRESSION: 1. Solitary 2 mm choledocholith in the lower third of the common bile duct near the ampulla. No biliary ductal dilatation. Common bile duct diameter 5 mm. 2. Cholelithiasis.  No evidence of acute cholecystitis. 3. The pancreas divisum. 4. Benign-appearing cysts in the liver and kidneys. 5. Mild left colonic diverticulosis. 6.  Aortic Atherosclerosis  (ICD10-I70.0). Electronically Signed   By: Ilona Sorrel M.D.   On: 02/18/2017 08:23   US Abdomen Limited Ruq  Result Date: 02/17/2017 CLINICAL DATA:  Abdominal pain 1-2 weeks. EXAM: ULTRASOUND ABDOMEN LIMITED RIGHT UPPER QUADRANT COMPARISON:  CT earlier today. FINDINGS: Gallbladder: Evidence of cholelithiasis with largest stone measuring 1.6 cm. Borderline gallbladder wall thickening measuring 3.1 mm. No pericholecystic fluid. Negative sonographic Murphy sign. Common bile duct: Diameter: 4 mm.  No definite ductal stones. Liver: 2.4 cm cyst over the right lobe. Minimal central right intrahepatic ductal dilatation. 4.3 cm cyst over the upper pole right kidney. IMPRESSION: Moderate cholelithiasis with borderline gallbladder wall thickening. Negative sonographic Murphy sign. No definite evidence of common bile duct stone and no evidence of common bile duct dilatation. 2.4 cm right liver cyst. 4.3 cm right renal cyst. Electronically Signed   By: Marin Olp M.D.   On: 02/17/2017 16:02    Microbiology: Recent Results (from the past 240 hour(s))  Culture, Urine     Status: Abnormal   Collection Time: 02/18/17  2:53 PM  Result Value Ref Range Status   Specimen Description URINE, CATHETERIZED  Final   Special Requests NONE  Final   Culture MULTIPLE SPECIES PRESENT, SUGGEST RECOLLECTION (A)  Final   Report Status 02/20/2017 FINAL  Final     Labs: Basic Metabolic Panel:  Recent Labs Lab 02/17/17 1140 02/18/17 0923 02/19/17 0640 02/20/17 0538 02/21/17 0537  NA 135 136 141 137 138  K 4.4 4.2 4.1 4.0 3.8  CL 102 106 107 107 105  CO2 25 21* 20* 21* 22  GLUCOSE 90 114* 82 90 81  BUN 26* 29* 25* 21* 19  CREATININE 1.94* 1.98* 1.79* 1.59* 1.56*  CALCIUM 8.8* 7.9* 8.5* 8.4* 8.4*   Liver Function Tests:  Recent Labs Lab 02/17/17 1140 02/18/17 0923 02/19/17 0640 02/21/17 0537  AST 19 22  23  35 23  ALT 24 19  20 24 21   ALKPHOS 89 103  105 110 75  BILITOT 1.7* 1.4*  1.4* 1.3* 1.0  PROT  7.5 6.5  6.6 7.4 6.6  ALBUMIN 3.3* 2.8*  2.8* 3.2* 2.8*    Recent Labs Lab 02/17/17 1140  LIPASE 28   CBC:  Recent Labs Lab 02/17/17 1140 02/19/17 0640 02/20/17 0538 02/21/17 0537  WBC 12.4* 14.7* 10.4 9.3  NEUTROABS 7.9*  --   --  6.6  HGB 14.2 12.8* 12.7* 12.5*  HCT 42.1 38.5* 37.1* 36.3*  MCV 90.5 91.2 90.3 90.1  PLT 169 213 237 249    Principal Problem:   UTI (urinary tract infection) Active Problems:   Quadriplegia (HCC)   Neurogenic bladder   LLQ pain   Cholelithiasis   Pressure injury of skin   CKD (chronic kidney disease), stage IV (HCC)   Constipation   Choledocholithiasis   Pancreatic divisum   Abnormal CT of the abdomen   Essential hypertension   Time coordinating discharge: 40 minutes  Signed:  Murray Hodgkins, MD [facility] 02/21/2017, 5:15 PM

## 2017-02-21 NOTE — Care Management CC44 (Signed)
Condition Code 44 Documentation Completed  Patient Details  Name: SHARBEL SAHAGUN MRN: [id number] Date of Birth: [birth year]   Condition Code 44 given:  Yes Patient signature on Condition Code 44 notice:  Yes Documentation of 2 MD's agreement:  Yes Code 44 added to claim:  Yes    Sherald Barge, RN 02/21/2017, 1:50 PM

## 2017-02-21 NOTE — Progress Notes (Signed)
    Subjective: Wants to go home. No abdominal pain, nausea, or vomiting. Does not want to eat breakfast.   Objective: Vital signs in last 24 hours: Temp:  [98 F (36.7 C)-98.6 F (37 C)] 98.2 F (36.8 C) (08/13 0513) Pulse Rate:  [62-108] 62 (08/13 0513) Resp:  [16-18] 18 (08/13 0513) BP: (155-182)/(80-96) 162/80 (08/13 0513) SpO2:  [95 %-100 %] 95 % (08/13 0513) Last BM Date: 02/20/17 General:   Alert and oriented, pleasant Head:  Normocephalic and atraumatic. Abdomen:  Bowel sounds present, soft, non-tender, non-distended.  Rectal: no stool in rectal vault, impaction resolved. No obvious mass.  Msk:  Symmetrical without gross deformities. Normal posture. Extremities:  Without  edema. Neurologic:  Alert and  oriented x4 Psych:  Alert and cooperative.   Intake/Output from previous day: 08/12 0701 - 08/13 0700 In: 360 [P.O.:360] Out: 1075 [Urine:1075] Intake/Output this shift: No intake/output data recorded.  Lab Results:  Recent Labs  02/19/17 0640 02/20/17 0538 02/21/17 0537  WBC 14.7* 10.4 9.3  HGB 12.8* 12.7* 12.5*  HCT 38.5* 37.1* 36.3*  PLT 213 237 249   BMET  Recent Labs  02/19/17 0640 02/20/17 0538 02/21/17 0537  NA 141 137 138  K 4.1 4.0 3.8  CL 107 107 105  CO2 20* 21* 22  GLUCOSE 82 90 81  BUN 25* 21* 19  CREATININE 1.79* 1.59* 1.56*  CALCIUM 8.5* 8.4* 8.4*   LFT  Recent Labs  02/18/17 0923 02/19/17 0640 02/21/17 0537  PROT 6.5  6.6 7.4 6.6  ALBUMIN 2.8*  2.8* 3.2* 2.8*  AST 22  23 35 23  ALT 19  20 24 21   ALKPHOS 103  105 110 75  BILITOT 1.4*  1.4* 1.3* 1.0  BILIDIR 0.3  0.3 0.2  --   IBILI 1.1* 1.1*  --     Assessment: 83 year old male presenting with abdominal pain and constipation, UTI, and imaging with non-obstructing small gallstone in distal CBD. Mild thickening of rectal wall on imaging.   CBD stone: managing conservatively as patient is clinically stable and remains so; he is  asymptomatic from a biliary  standpoint, and normal LFTs.   Constipation: with impaction s/p milk of molasses enema with good results. Now on Miralax BID. Rectal exam today with no stool or mass in rectal vault.    Plan: Continue Miralax BID Improved and stable from a GI standpoint Follow clinically Hopeful discharge in near future: patient desires to go home.  Will follow-up as outpatient and consider flex sig  Annitta Needs, PhD, ANP-BC [facility]    LOS: 1 day    02/21/2017, 7:51 AM

## 2017-02-21 NOTE — Progress Notes (Signed)
This RN called and spoke with Bricyn Labrada (wife). This RN reviewed D/C instructions with wife. Wife verbalized understanding. Questions answer. Patient A&O x4 but forgetful. Patient's questions answered, verbalizes understanding. Patient transferred to EMS stretcher. No distress noted. Patient did not have clothes. Bath belongings and AVS sent with patient. Patient off unit via stretcher by EMTs with no difficulties.

## 2017-02-21 NOTE — Telephone Encounter (Signed)
APPT MADE AND NURSE AWARE AND WILL TELL PATENT

## 2017-04-13 ENCOUNTER — Ambulatory Visit: Payer: Medicare Other | Admitting: Gastroenterology

## 2017-06-08 ENCOUNTER — Ambulatory Visit: Payer: Medicare Other | Admitting: Gastroenterology

## 2017-06-08 ENCOUNTER — Encounter: Payer: Self-pay | Admitting: Gastroenterology

## 2017-06-08 VITALS — BP 166/93 | HR 74 | Temp 96.6°F | Ht 69.0 in

## 2017-06-08 DIAGNOSIS — K8050 Calculus of bile duct without cholangitis or cholecystitis without obstruction: Secondary | ICD-10-CM | POA: Diagnosis not present

## 2017-06-08 NOTE — Progress Notes (Signed)
Referring Provider: Iona Beard, MD Primary Care Physician:  Iona Beard, MD Primary GI: Dr. Dr. Gala Romney   Chief Complaint  Patient presents with  . Abdominal Pain  . Diarrhea    HPI:   Christopher Mcdonald is a 83 y.o. male presenting today in hospital follow-up after admission for abdominal pain and constipation, UTI, and imaging with non-obstructing small gallstone in distal CBD. Mild thickening of rectal wall also noted on imaging. CBD stone was managed conservatively as LFTs were normal. He was given a MOM enema while inpatient with good results and Miralax BID. Here for follow-up and to consider flex sig for direct visualization of rectum.  He denies abdominal pain but states vague discomfort. No N/V. States "when I move in my chair, I am aware I have a belly". States intermittent diarrhea, alternating with constipation, mainly diet-related. We discussed the possibility of a flex sig. He tells me that he has lived a long life, and he does not desire to do any procedures or surgeries due to his age. He is thankful for each day and trying to enjoy his life.     Past Medical History:  Diagnosis Date  . Choledocholithiasis 02/18/2017  . Hypertension   . Influenza with pneumonia 10/26/2014  . Pancreatic divisum 02/18/2017  . Spinal cord injury, cervical region ([place])    Quadriparesis    Past Surgical History:  Procedure Laterality Date  . CERVICAL DISC SURGERY    . PARTIAL COLECTOMY     Dr. Irving Shows: Bowel blockage. Patient denies malignancy.  Marland Kitchen PROSTATE SURGERY      Current Outpatient Medications  Medication Sig Dispense Refill  . acetaminophen (TYLENOL) 500 MG tablet Take 500 mg by mouth every 6 (six) hours as needed.    . baclofen (LIORESAL) 10 MG tablet Take 5 mg by mouth 2 (two) times daily.    . cloNIDine (CATAPRES - DOSED IN MG/24 HR) 0.1 mg/24hr patch Place 0.1 mg onto the skin once a week. WEDNESDAY    . meclizine (ANTIVERT) 12.5 MG tablet Take 12.5 mg by mouth 3  (three) times daily as needed for dizziness.    . polyethylene glycol (MIRALAX / GLYCOLAX) packet Take 17 g by mouth 2 (two) times daily. (Patient taking differently: Take 17 g by mouth daily as needed. )    . Carboxymethylcellul-Glycerin (CLEAR EYES FOR DRY EYES OP) Apply 1 drop to eye daily.    . folic acid (FOLVITE) 1 MG tablet Take 1 mg by mouth daily.     No current facility-administered medications for this visit.     Allergies as of 06/08/2017 - Review Complete 06/08/2017  Allergen Reaction Noted  . Diazepam  09/01/2006  . Sulfamethoxazole-trimethoprim  11/06/2007      Social History   Socioeconomic History  . Marital status: Married    Spouse name: None  . Number of children: None  . Years of education: None  . Highest education level: None  Social Needs  . Financial resource strain: None  . Food insecurity - worry: None  . Food insecurity - inability: None  . Transportation needs - medical: None  . Transportation needs - non-medical: None  Occupational History  . None  Tobacco Use  . Smoking status: Never Smoker  . Smokeless tobacco: Never Used  Substance and Sexual Activity  . Alcohol use: No  . Drug use: No  . Sexual activity: None  Other Topics Concern  . None  Social History Narrative  . None  Review of Systems: Gen: Denies fever, chills, anorexia. Denies fatigue, weakness, weight loss.  CV: Denies chest pain, palpitations, syncope, peripheral edema, and claudication. Resp: Denies dyspnea at rest, cough, wheezing, coughing up blood, and pleurisy. GI: see HPI  Derm: Denies rash, itching, dry skin Psych: Denies depression, anxiety, memory loss, confusion. No homicidal or suicidal ideation.  Heme: Denies bruising, bleeding, and enlarged lymph nodes.  Physical Exam: BP (!) 166/93   Pulse 74   Temp (!) 96.6 F (35.9 C) (Oral)   Ht 5\' 9"  (1.753 m)   BMI 24.37 kg/m  General:   Alert and oriented. No distress noted. Pleasant and cooperative.  Quadriplegia Head:  Normocephalic and atraumatic. Eyes:  Conjuctiva clear without scleral icterus. Mouth:  Oral mucosa pink and moist.  Abdomen:  +BS, soft, non-tender and non-distended. Limited with patient in wheelchair.  Extremities:  Without edema. Neurologic:  Alert and  oriented x4 Psych:  Alert and cooperative. Normal mood and affect.

## 2017-06-08 NOTE — Assessment & Plan Note (Signed)
Non-obstructing small gallstone noted during hospitalization, with conservative measures recommended. Does not have typical biliary symptoms, tolerating diet, no N/V. Desires to avoid any procedures at this time due to age, which is appropriate. Will monitor clinically for any changes. As of note, rectal wall thickening also noted on imaging. I spent approximately 20 minutes discussing flex sig and diagnostic procedures with him, and he desires to avoid any further evaluation. I feel this appropriate, given his age and comorbidities. He will call us if he has any GI issues or concerns, otherwise we will see him back as needed.

## 2017-06-08 NOTE — Patient Instructions (Signed)
For constipation: take Miralax daily to twice a day as needed. If you have diarrhea, you may take Imodium. Try not to take too much Imodium so that you get constipated.   If you have any problems eating, nausea, vomiting, or worsening bowel issues, please let me know.   Otherwise, we will see you back as needed!

## 2017-06-09 NOTE — Progress Notes (Signed)
CC'D TO PCP °

## 2017-08-08 ENCOUNTER — Telehealth: Payer: Self-pay | Admitting: Internal Medicine

## 2017-08-08 NOTE — Telephone Encounter (Signed)
Pt's wife has questions about his medication. Please call her at [phone]

## 2017-08-09 NOTE — Telephone Encounter (Signed)
pts spouse said it's not often but not that far apart either. pts spouse said she really can't give an estimate of how often the diarrhea is.

## 2017-08-09 NOTE — Telephone Encounter (Signed)
He had a fecal impaction while inpatient, which was why we stopped this. Is he taking Miralax right now? If so, stop the Miralax and let's see what happens. I would be hesitant to take Colestid every day due to concerns for constipation. Just let me know if he is taking Miralax now.

## 2017-08-09 NOTE — Telephone Encounter (Signed)
Pt takes the Miralax only as needed. pts spouse says she gives it to her spouse when he can't have a bowel movement. When miralax is taken, pt has a bowel movement the next day.  Pts spouse spoke with Dr. Hill(PCP) and Dr. Berdine Addison said maybe pt doesn't have to completely stop Colestid but take it when needed only since pt has episodes of diarrhea.

## 2017-08-09 NOTE — Telephone Encounter (Signed)
Spoke with pts spouse. Pt was asked to d/c Colestid at the ED on 02/17/17 and start Miralax/Glycolax. Pts spouse is concerned with the amount of Diarrhea her husband has since he has been off Colestid and wants to see if pt can go back on Colestid. Pt was seen in our office 06/08/18 ans asked to take Imodium when diarrhea occurs, but not to take it on a daily basis.   Pts spouse is pts care giver and reports that its a lot to handle when pt isn't on Colestid.

## 2017-08-09 NOTE — Telephone Encounter (Signed)
How often is he having diarrhea? I wonder if we just need to see him back to sort this out further. I don't want him to end up getting impacted again.

## 2017-08-10 NOTE — Telephone Encounter (Signed)
Pt  Spouse notified.

## 2017-08-10 NOTE — Telephone Encounter (Signed)
I would stick with Imodium just as needed.

## 2017-09-28 ENCOUNTER — Telehealth: Payer: Self-pay | Admitting: Internal Medicine

## 2017-09-28 NOTE — Telephone Encounter (Signed)
[phone], please call patient, wife stated he was put on immodium and it is not working

## 2017-09-28 NOTE — Telephone Encounter (Signed)
Spoke with pts spouse. Pt hasn't had problems with constipation since his bowel move. pts spouse isn't giving pt miralax at this time. Pt is taking imodium which doesn't help all the time when diarrhea occurs. Spouse would like pt to take Cholestipol again.

## 2017-09-29 NOTE — Telephone Encounter (Signed)
Yes, may go back to taking that. Monitor for constipation.

## 2017-09-29 NOTE — Telephone Encounter (Signed)
Pt notified and will monitor for constipation.

## 2017-10-04 ENCOUNTER — Telehealth: Payer: Self-pay | Admitting: Internal Medicine

## 2017-10-04 NOTE — Telephone Encounter (Signed)
I spoke to pt's wife, Lesleigh Noe. Pt went back on colestipol and he had a BM today. He had a good one and then he has had 3 episodes of loose/runny stool. She gave him one Imodium and it has slacked some.  She will just see how he does the remainder of the day and not try anymore Imodium now.  She will call back if he doesn't improve and is aware I will let Roseanne Kaufman, NP know about this episode. FYI to Walden Field, NP today since Vicente Males is out of the office.

## 2017-10-04 NOTE — Telephone Encounter (Signed)
Pt's wife called asking to speak with the nurse. I told her that AM would be back tomorrow and I could take a message. Pt has been constipated for 3 days and he took a medication to make him have a BM and now he has had diarrhea all day per wife. She has questions about what can he take to stop it. Please advise and call [phone]

## 2017-10-05 NOTE — Telephone Encounter (Signed)
Noted  

## 2018-07-31 ENCOUNTER — Encounter (HOSPITAL_BASED_OUTPATIENT_CLINIC_OR_DEPARTMENT_OTHER): Payer: Medicare Other | Attending: Internal Medicine

## 2018-07-31 DIAGNOSIS — L89152 Pressure ulcer of sacral region, stage 2: Secondary | ICD-10-CM | POA: Insufficient documentation

## 2018-07-31 DIAGNOSIS — Z87891 Personal history of nicotine dependence: Secondary | ICD-10-CM | POA: Insufficient documentation

## 2018-07-31 DIAGNOSIS — M5001 Cervical disc disorder with myelopathy,  high cervical region: Secondary | ICD-10-CM | POA: Insufficient documentation

## 2018-07-31 DIAGNOSIS — J449 Chronic obstructive pulmonary disease, unspecified: Secondary | ICD-10-CM | POA: Insufficient documentation

## 2018-08-04 DIAGNOSIS — M5001 Cervical disc disorder with myelopathy,  high cervical region: Secondary | ICD-10-CM | POA: Diagnosis not present

## 2018-08-04 DIAGNOSIS — L89152 Pressure ulcer of sacral region, stage 2: Secondary | ICD-10-CM | POA: Diagnosis present

## 2018-08-04 DIAGNOSIS — J449 Chronic obstructive pulmonary disease, unspecified: Secondary | ICD-10-CM | POA: Diagnosis not present

## 2018-08-04 DIAGNOSIS — Z87891 Personal history of nicotine dependence: Secondary | ICD-10-CM | POA: Diagnosis not present

## 2018-08-25 ENCOUNTER — Encounter (HOSPITAL_BASED_OUTPATIENT_CLINIC_OR_DEPARTMENT_OTHER): Payer: Medicare Other | Attending: Internal Medicine

## 2018-08-25 DIAGNOSIS — L89153 Pressure ulcer of sacral region, stage 3: Secondary | ICD-10-CM | POA: Insufficient documentation

## 2018-08-25 DIAGNOSIS — I509 Heart failure, unspecified: Secondary | ICD-10-CM | POA: Diagnosis not present

## 2018-08-25 DIAGNOSIS — L89313 Pressure ulcer of right buttock, stage 3: Secondary | ICD-10-CM | POA: Insufficient documentation

## 2018-08-25 DIAGNOSIS — J449 Chronic obstructive pulmonary disease, unspecified: Secondary | ICD-10-CM | POA: Insufficient documentation

## 2018-09-08 DIAGNOSIS — L89153 Pressure ulcer of sacral region, stage 3: Secondary | ICD-10-CM | POA: Diagnosis not present

## 2018-09-22 ENCOUNTER — Encounter (HOSPITAL_BASED_OUTPATIENT_CLINIC_OR_DEPARTMENT_OTHER): Payer: Medicare Other | Attending: Internal Medicine

## 2018-09-22 DIAGNOSIS — L89313 Pressure ulcer of right buttock, stage 3: Secondary | ICD-10-CM | POA: Diagnosis not present

## 2018-09-22 DIAGNOSIS — L89153 Pressure ulcer of sacral region, stage 3: Secondary | ICD-10-CM | POA: Insufficient documentation

## 2018-09-22 DIAGNOSIS — I509 Heart failure, unspecified: Secondary | ICD-10-CM | POA: Insufficient documentation

## 2018-09-22 DIAGNOSIS — J449 Chronic obstructive pulmonary disease, unspecified: Secondary | ICD-10-CM | POA: Diagnosis not present

## 2018-10-12 ENCOUNTER — Telehealth (HOSPITAL_COMMUNITY): Payer: Self-pay | Admitting: Occupational Therapy

## 2018-10-12 NOTE — Telephone Encounter (Signed)
Called [facility] and spoke with Otila Kluver about pt's canceled evaluation and plan to reschedule when clinic reopens.    Guadelupe Sabin, OTR/L  [phone] 10/12/2018

## 2018-10-12 NOTE — Telephone Encounter (Signed)
Patient was contacted today regarding the temporary reduction of OP rehab services due to concerns for community transmission of Covid-19. Spoke with caregiver and advised we will call to reschedule wheelchair evaluation when clinic reopens.    Guadelupe Sabin, OTR/L  [phone] 10/12/2018

## 2018-10-16 ENCOUNTER — Ambulatory Visit (HOSPITAL_COMMUNITY): Payer: Medicare Other

## 2018-10-16 ENCOUNTER — Encounter (HOSPITAL_COMMUNITY): Payer: Self-pay

## 2018-10-27 ENCOUNTER — Encounter (HOSPITAL_BASED_OUTPATIENT_CLINIC_OR_DEPARTMENT_OTHER): Payer: Medicare Other | Attending: Internal Medicine

## 2018-10-27 ENCOUNTER — Other Ambulatory Visit (HOSPITAL_COMMUNITY)
Admission: RE | Admit: 2018-10-27 | Discharge: 2018-10-27 | Disposition: A | Discharge status: Home / Self Care | Payer: Medicare Other | Source: Other Acute Inpatient Hospital | Attending: Internal Medicine | Admitting: Internal Medicine

## 2018-10-27 DIAGNOSIS — I130 Hypertensive heart and chronic kidney disease with heart failure and stage 1 through stage 4 chronic kidney disease, or unspecified chronic kidney disease: Secondary | ICD-10-CM | POA: Insufficient documentation

## 2018-10-27 DIAGNOSIS — N189 Chronic kidney disease, unspecified: Secondary | ICD-10-CM | POA: Insufficient documentation

## 2018-10-27 DIAGNOSIS — I509 Heart failure, unspecified: Secondary | ICD-10-CM | POA: Diagnosis not present

## 2018-10-27 DIAGNOSIS — J449 Chronic obstructive pulmonary disease, unspecified: Secondary | ICD-10-CM | POA: Insufficient documentation

## 2018-10-27 DIAGNOSIS — L89313 Pressure ulcer of right buttock, stage 3: Secondary | ICD-10-CM | POA: Insufficient documentation

## 2018-10-27 DIAGNOSIS — L89314 Pressure ulcer of right buttock, stage 4: Secondary | ICD-10-CM | POA: Insufficient documentation

## 2018-10-30 LAB — AEROBIC CULTURE? (SUPERFICIAL SPECIMEN)

## 2018-10-30 LAB — AEROBIC CULTURE W GRAM STAIN (SUPERFICIAL SPECIMEN)

## 2018-11-17 ENCOUNTER — Encounter (HOSPITAL_BASED_OUTPATIENT_CLINIC_OR_DEPARTMENT_OTHER): Payer: Medicare Other | Attending: Internal Medicine

## 2018-11-17 DIAGNOSIS — L89152 Pressure ulcer of sacral region, stage 2: Secondary | ICD-10-CM | POA: Insufficient documentation

## 2018-11-17 DIAGNOSIS — I509 Heart failure, unspecified: Secondary | ICD-10-CM | POA: Insufficient documentation

## 2018-11-17 DIAGNOSIS — J449 Chronic obstructive pulmonary disease, unspecified: Secondary | ICD-10-CM | POA: Diagnosis not present

## 2018-12-12 ENCOUNTER — Telehealth (HOSPITAL_COMMUNITY): Payer: Self-pay | Admitting: Occupational Therapy

## 2018-12-12 NOTE — Telephone Encounter (Signed)
Called spoke to Mrs. Meyn about husband evaluation, they want to cx this one due to covid-19 and get a new referral from the MD when ready to come into our office

## 2018-12-15 ENCOUNTER — Other Ambulatory Visit: Payer: Self-pay

## 2018-12-15 ENCOUNTER — Encounter (HOSPITAL_BASED_OUTPATIENT_CLINIC_OR_DEPARTMENT_OTHER): Payer: Self-pay

## 2018-12-15 ENCOUNTER — Encounter (HOSPITAL_BASED_OUTPATIENT_CLINIC_OR_DEPARTMENT_OTHER): Payer: Medicare Other | Attending: Internal Medicine

## 2018-12-15 DIAGNOSIS — I509 Heart failure, unspecified: Secondary | ICD-10-CM | POA: Diagnosis not present

## 2018-12-15 DIAGNOSIS — L89314 Pressure ulcer of right buttock, stage 4: Secondary | ICD-10-CM | POA: Insufficient documentation

## 2018-12-15 DIAGNOSIS — L89152 Pressure ulcer of sacral region, stage 2: Secondary | ICD-10-CM | POA: Diagnosis not present

## 2018-12-15 DIAGNOSIS — J449 Chronic obstructive pulmonary disease, unspecified: Secondary | ICD-10-CM | POA: Diagnosis not present

## 2018-12-15 DIAGNOSIS — M5001 Cervical disc disorder with myelopathy,  high cervical region: Secondary | ICD-10-CM | POA: Insufficient documentation

## 2019-01-09 DIAGNOSIS — L89314 Pressure ulcer of right buttock, stage 4: Secondary | ICD-10-CM | POA: Diagnosis not present

## 2019-01-31 ENCOUNTER — Other Ambulatory Visit (HOSPITAL_COMMUNITY)
Admission: RE | Admit: 2019-01-31 | Discharge: 2019-01-31 | Disposition: A | Discharge status: Home / Self Care | Payer: Medicare Other | Source: Other Acute Inpatient Hospital | Attending: Physician Assistant | Admitting: Physician Assistant

## 2019-01-31 ENCOUNTER — Encounter (HOSPITAL_BASED_OUTPATIENT_CLINIC_OR_DEPARTMENT_OTHER): Payer: Medicare Other | Attending: Internal Medicine

## 2019-01-31 DIAGNOSIS — J449 Chronic obstructive pulmonary disease, unspecified: Secondary | ICD-10-CM | POA: Diagnosis not present

## 2019-01-31 DIAGNOSIS — L89314 Pressure ulcer of right buttock, stage 4: Secondary | ICD-10-CM | POA: Insufficient documentation

## 2019-01-31 DIAGNOSIS — L89153 Pressure ulcer of sacral region, stage 3: Secondary | ICD-10-CM | POA: Diagnosis not present

## 2019-01-31 DIAGNOSIS — M5001 Cervical disc disorder with myelopathy,  high cervical region: Secondary | ICD-10-CM | POA: Insufficient documentation

## 2019-01-31 DIAGNOSIS — I509 Heart failure, unspecified: Secondary | ICD-10-CM | POA: Diagnosis not present

## 2019-02-03 LAB — AEROBIC CULTURE? (SUPERFICIAL SPECIMEN): Culture: NORMAL

## 2019-02-03 LAB — AEROBIC CULTURE W GRAM STAIN (SUPERFICIAL SPECIMEN)

## 2019-02-07 ENCOUNTER — Encounter (HOSPITAL_COMMUNITY): Payer: Self-pay

## 2019-02-07 ENCOUNTER — Emergency Department (HOSPITAL_COMMUNITY)
Admission: EM | Admit: 2019-02-07 | Discharge: 2019-02-08 | Disposition: A | Discharge status: Home / Self Care | Payer: Medicare Other | Attending: Emergency Medicine | Admitting: Emergency Medicine

## 2019-02-07 ENCOUNTER — Other Ambulatory Visit: Payer: Self-pay

## 2019-02-07 ENCOUNTER — Emergency Department (HOSPITAL_COMMUNITY): Payer: Medicare Other

## 2019-02-07 DIAGNOSIS — Z79899 Other long term (current) drug therapy: Secondary | ICD-10-CM | POA: Insufficient documentation

## 2019-02-07 DIAGNOSIS — I129 Hypertensive chronic kidney disease with stage 1 through stage 4 chronic kidney disease, or unspecified chronic kidney disease: Secondary | ICD-10-CM | POA: Insufficient documentation

## 2019-02-07 DIAGNOSIS — N3090 Cystitis, unspecified without hematuria: Secondary | ICD-10-CM

## 2019-02-07 DIAGNOSIS — N184 Chronic kidney disease, stage 4 (severe): Secondary | ICD-10-CM | POA: Diagnosis not present

## 2019-02-07 DIAGNOSIS — I959 Hypotension, unspecified: Secondary | ICD-10-CM | POA: Insufficient documentation

## 2019-02-07 LAB — CBC WITH DIFFERENTIAL/PLATELET
Abs Immature Granulocytes: 0.01 10*3/uL (ref 0.00–0.07)
Basophils Absolute: 0 10*3/uL (ref 0.0–0.1)
Basophils Relative: 1 %
Eosinophils Absolute: 0.2 10*3/uL (ref 0.0–0.5)
Eosinophils Relative: 3 %
HCT: 41 % (ref 39.0–52.0)
Hemoglobin: 12.7 g/dL — ABNORMAL LOW (ref 13.0–17.0)
Immature Granulocytes: 0 %
Lymphocytes Relative: 34 %
Lymphs Abs: 2.2 10*3/uL (ref 0.7–4.0)
MCH: 28.1 pg (ref 26.0–34.0)
MCHC: 31 g/dL (ref 30.0–36.0)
MCV: 90.7 fL (ref 80.0–100.0)
Monocytes Absolute: 0.5 10*3/uL (ref 0.1–1.0)
Monocytes Relative: 8 %
Neutro Abs: 3.4 10*3/uL (ref 1.7–7.7)
Neutrophils Relative %: 54 %
Platelets: 196 10*3/uL (ref 150–400)
RBC: 4.52 MIL/uL (ref 4.22–5.81)
RDW: 13.9 % (ref 11.5–15.5)
WBC: 6.3 10*3/uL (ref 4.0–10.5)
nRBC: 0 % (ref 0.0–0.2)

## 2019-02-07 LAB — URINALYSIS, ROUTINE W REFLEX MICROSCOPIC
Bacteria, UA: NONE SEEN
Bilirubin Urine: NEGATIVE
Glucose, UA: NEGATIVE mg/dL
Ketones, ur: NEGATIVE mg/dL
Nitrite: NEGATIVE
Protein, ur: 30 mg/dL — AB
RBC / HPF: >50 RBC/hpf — ABNORMAL HIGH (ref 0–5)
Specific Gravity, Urine: 1.013 (ref 1.005–1.030)
pH: 5 (ref 5.0–8.0)

## 2019-02-07 LAB — TROPONIN I (HIGH SENSITIVITY): Troponin I (High Sensitivity): 5 ng/L (ref <18)

## 2019-02-07 LAB — COMPREHENSIVE METABOLIC PANEL
ALT: 11 U/L (ref 0–44)
AST: 14 U/L — ABNORMAL LOW (ref 15–41)
Albumin: 3.6 g/dL (ref 3.5–5.0)
Alkaline Phosphatase: 92 U/L (ref 38–126)
Anion gap: 7 (ref 5–15)
BUN: 31 mg/dL — ABNORMAL HIGH (ref 8–23)
CO2: 26 mmol/L (ref 22–32)
Calcium: 8.8 mg/dL — ABNORMAL LOW (ref 8.9–10.3)
Chloride: 103 mmol/L (ref 98–111)
Creatinine, Ser: 1.67 mg/dL — ABNORMAL HIGH (ref 0.61–1.24)
GFR calc Af Amer: 40 mL/min — ABNORMAL LOW (ref >60)
GFR calc non Af Amer: 35 mL/min — ABNORMAL LOW (ref >60)
Glucose, Bld: 87 mg/dL (ref 70–99)
Potassium: 5 mmol/L (ref 3.5–5.1)
Sodium: 136 mmol/L (ref 135–145)
Total Bilirubin: 1.3 mg/dL — ABNORMAL HIGH (ref 0.3–1.2)
Total Protein: 7 g/dL (ref 6.5–8.1)

## 2019-02-07 LAB — LACTIC ACID, PLASMA: Lactic Acid, Venous: 1 mmol/L (ref 0.5–1.9)

## 2019-02-07 MED ORDER — SODIUM CHLORIDE 0.9 % IV BOLUS
500.0000 mL | Freq: Once | INTRAVENOUS | Status: AC
  Administered 2019-02-07: 500 mL via INTRAVENOUS

## 2019-02-07 MED ORDER — CEPHALEXIN 500 MG PO CAPS
500.0000 mg | ORAL_CAPSULE | Freq: Once | ORAL | Status: AC
  Administered 2019-02-07: 500 mg via ORAL
  Filled 2019-02-07: qty 1

## 2019-02-07 MED ORDER — CEPHALEXIN 500 MG PO CAPS: 500.0000 mg | ORAL_CAPSULE | Freq: Four times a day (QID) | ORAL | 0 refills | Status: AC

## 2019-02-07 NOTE — Discharge Instructions (Addendum)
Continue to drink fluids.  Take the antibiotic as directed.  Follow-up with your primary doctor in 2-3 days for recheck.

## 2019-02-07 NOTE — ED Notes (Signed)
Attempted in and out urinary catheter on pt. Unable to obtain urine. Catheter curled in bladder. Due to pt discomfort and moderate amount of blood in catheter, did not attempt again.

## 2019-02-07 NOTE — ED Triage Notes (Signed)
Ems reports home health was at p's residence and reports pt has had decreased appetite recently and low bp today.  Home health reports bp 78/40 and ems reports 128/70.  O2 sat 94% on room air.

## 2019-02-07 NOTE — ED Provider Notes (Signed)
   Pt signed out to me by Evalee Jefferson, PA-C at end of shift.  U/a and Abd films pending.    Pt is a quadriplegic sent to ER at the recommendation of home health for hypotension.  He has not been hypotensive here and denies any symptoms.   Labs thus far are reassuring, but pt with known hx of UTI's and elevated BUN today, so IVF's ordered and we will determine appropriate dispo when work up is complete.    Of note, pt does have a decubitus ulcer over sacrum.  Area is bandaged, no significant drainage or surrounding erythema. It has a packing in place and pt reports that the area has been present for some time, but healing.  I do not feel the pt is septic or that the ulcer is related to his presenting sx's  Nursing attempted in and out cath urine, but unsuccessful.  Pt states that he will try drinking water.    U/a finally obtained.  Shows 21-50 WBC's, small leukocytes and large amt of blood which is likely related to trauma from attempted in and out cath.  Urine culture pending and pt will be started on Keflex.  He is well appearing and he has not been hypotensive and he is requesting d/c home.  I feel this is appropriate and strict return precautions discussed.     Christopher Parkinson, PA-C 02/08/19 0002    Christopher Manifold, MD 02/08/19 [phone]

## 2019-02-07 NOTE — ED Provider Notes (Signed)
[facility] EMERGENCY DEPARTMENT Provider Note   CSN: [id number] Arrival date & time: 02/07/19  1507     History   Chief Complaint Chief Complaint  Patient presents with  . Hypotension    HPI Christopher Mcdonald is a 85 y.o. 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  Patient denies any symptoms whatsoever, however home health checked his blood pressure this morning and was found to be 78/40.  When EMS arrived it was 128/70.  He does endorse decreased appetite over the past several days but denies any abdominal pain, dysuria, nausea or vomiting. He also denies constipation last bm ytd.  He also denies chest pain or shortness of breath.  He states that he did not want to be transported here, but his nurse and his wife are insistent that he come.  He is currently symptom-free.     The history is provided by the patient and the EMS personnel.    Past Medical History:  Diagnosis Date  . Choledocholithiasis 02/18/2017  . Hypertension   . Influenza with pneumonia 10/26/2014  . Pancreatic divisum 02/18/2017  . Spinal cord injury, cervical region [facility])    Quadriparesis    Patient Active Problem List   Diagnosis Date Noted  . UTI (urinary tract infection) 02/19/2017  . Pressure injury of skin 02/18/2017  . CKD (chronic kidney disease), stage IV ([place]) 02/18/2017  . Constipation 02/18/2017  . Choledocholithiasis 02/18/2017  . Pancreatic divisum 02/18/2017  . Essential hypertension 02/18/2017  . Abnormal CT of the abdomen   . LLQ pain 02/17/2017  . Cholelithiasis 02/17/2017  . Abdominal pain, acute, generalized   . Influenza with pneumonia 10/26/2014  . Renal failure (ARF), acute on chronic (HCC) 10/24/2014  . Sepsis ([place]) 10/24/2014  . Fever 10/24/2014  . Cough 10/24/2014  . Acute renal failure syndrome ([place])   . Elevated lactic acid level   . Urinary tract  infectious disease   . NECK PAIN, RIGHT 12/30/2008  . DYSPHAGIA UNSPECIFIED 12/27/2008  . OTHER SEBORRHEIC DERMATITIS 10/04/2008  . PRESSURE ULCER BUTTOCK 08/24/2007  . RESTRICTIVE LUNG DISEASE 06/21/2007  . Quadriplegia ([place]) 09/08/2006  . HYPERLIPIDEMIA 06/06/2006  . DEPRESSION 06/06/2006  . CROHN'S DISEASE 06/06/2006  . IBS 06/06/2006  . KIDNEY DISEASE, CHRONIC, STAGE III 06/06/2006  . Neurogenic bladder 06/06/2006  . INCONTINENCE 06/06/2006  . SPINAL CORD INJURY 06/06/2006    Past Surgical History:  Procedure Laterality Date  . CERVICAL DISC SURGERY    . PARTIAL COLECTOMY     Dr. Irving Shows: Bowel blockage. Patient denies malignancy.  Marland Kitchen PROSTATE SURGERY          Home Medications    Prior to Admission medications   Medication Sig Start Date End Date Taking? Authorizing Provider  baclofen (LIORESAL) 10 MG tablet Take 5 mg by mouth 2 (two) times daily. 10/23/14  Yes [provider]  cloNIDine (CATAPRES - DOSED IN MG/24 HR) 0.1 mg/24hr patch Place 0.1 mg onto the skin once a week. [facility] 10/08/14  Yes [provider]  acetaminophen (TYLENOL) 500 MG tablet Take 500 mg by mouth every 6 (six) hours as needed.    [provider]  meclizine (ANTIVERT) 12.5 MG tablet Take 12.5 mg by mouth 3 (three) times daily as needed for dizziness.    [provider]  polyethylene glycol (MIRALAX / GLYCOLAX) packet Take 17 g by mouth 2 (two) times daily. Patient taking  differently: Take 17 g by mouth daily as needed.  02/21/17   Samuella Cota, MD    Family History No family history on file.  Social History Social History   Tobacco Use  . Smoking status: Never Smoker  . Smokeless tobacco: Never Used  Substance Use Topics  . Alcohol use: No  . Drug use: No     Allergies   Diazepam and Sulfamethoxazole-trimethoprim   Review of Systems Review of Systems  Constitutional: Positive for appetite change. Negative for fever.  HENT: Negative  for congestion and sore throat.   Eyes: Negative.   Respiratory: Negative for chest tightness and shortness of breath.   Cardiovascular: Negative for chest pain.  Gastrointestinal: Negative for abdominal pain, nausea and vomiting.  Genitourinary: Negative.  Negative for hematuria and urgency.  Musculoskeletal: Negative for joint swelling and neck pain.  Skin: Negative.  Negative for rash and wound.  Neurological: Negative for dizziness, light-headedness, numbness and headaches.       Patient is bedbound at baseline secondary to quadriplegia.  Psychiatric/Behavioral: Negative.      Physical Exam Updated Vital Signs BP 107/73 (BP Location: Right Arm)   Pulse 64   Temp 97.8 F (36.6 C) (Oral)   Resp 16   Ht 5\' 9"  (1.753 m)   Wt 54.4 kg   SpO2 99%   BMI 17.72 kg/m   Physical Exam Vitals signs and nursing note reviewed.  Constitutional:      General: He is not in acute distress.    Appearance: He is well-developed. He is not toxic-appearing.  HENT:     Head: Normocephalic and atraumatic.     Nose: Nose normal.     Mouth/Throat:     Mouth: Mucous membranes are moist.  Eyes:     Conjunctiva/sclera: Conjunctivae normal.  Neck:     Musculoskeletal: Normal range of motion.  Cardiovascular:     Rate and Rhythm: Normal rate and regular rhythm.     Heart sounds: Normal heart sounds.  Pulmonary:     Effort: Pulmonary effort is normal.     Breath sounds: Normal breath sounds. No wheezing.  Abdominal:     General: Bowel sounds are normal. There is no distension.     Palpations: Abdomen is soft.     Tenderness: There is no abdominal tenderness.  Musculoskeletal: Normal range of motion.  Skin:    General: Skin is warm and dry.  Neurological:     Mental Status: He is alert.     Comments: Normal per baseline.  Quadriplegia.      ED Treatments / Results  Labs (all labs ordered are listed, but only abnormal results are displayed) Labs Reviewed  CBC WITH  DIFFERENTIAL/PLATELET - Abnormal; Notable for the following components:      Result Value   Hemoglobin 12.7 (*)    All other components within normal limits  COMPREHENSIVE METABOLIC PANEL - Abnormal; Notable for the following components:   BUN 31 (*)    Creatinine, Ser 1.67 (*)    Calcium 8.8 (*)    AST 14 (*)    Total Bilirubin 1.3 (*)    GFR calc non Af Amer 35 (*)    GFR calc Af Amer 40 (*)    All other components within normal limits  LACTIC ACID, PLASMA  URINALYSIS, ROUTINE W REFLEX MICROSCOPIC  TROPONIN I (HIGH SENSITIVITY)    EKG EKG Interpretation  Date/Time:  Wednesday February 07 2019 15:21:47 EDT Ventricular Rate:  64 PR Interval:  198 QRS Duration: 68 QT Interval:  416 QTC Calculation: 429 R Axis:   96 Text Interpretation:  Normal sinus rhythm Rightward axis Septal infarct , age undetermined Abnormal ECG Confirmed by Virgel Manifold [id number]) on 02/07/2019 4:30:10 PM   Radiology No results found.  Procedures Procedures (including critical care time)  Medications Ordered in ED Medications - No data to display   Initial Impression / Assessment and Plan / ED Course  I have reviewed the triage vital signs and the nursing notes.  Pertinent labs & imaging results that were available during my care of the patient were reviewed by me and considered in my medical decision making (see chart for details).        PT with isolated hypotension prior to arrival with no complaint of sx here.  Labs, acute abd series, urine ordered with no sign of infection, lactate normal range. Chronic renal insufficiency which is stable, BUN elevated today, possible dehydration - 500 cc bolus ordered.  No cp, ekg reviewed, troponin negative.  Suspect possible erroneous BP read at home given pt has been normotensive here.  Pending UA at this time.  Pt does have a history of uti, but denies sx of this today. Chart reveals neurogenic bladder, but pt states can control.  Pending sample.   Discussed with Kem Parkinson who assumes care.   Final Clinical Impressions(s) / ED Diagnoses   Final diagnoses:  None    ED Discharge Orders    None       Landis Martins 02/07/19 1747    Virgel Manifold, MD 02/07/19 1816

## 2019-02-08 NOTE — ED Notes (Signed)
EMS back in ED stating pt bp is 200/100. This nurse went to truck checked pt bp and pts bp is 187/82. PA notified. Gave ok to send home.

## 2019-02-09 DIAGNOSIS — L89314 Pressure ulcer of right buttock, stage 4: Secondary | ICD-10-CM | POA: Diagnosis not present

## 2019-02-09 LAB — URINE CULTURE: Special Requests: NORMAL

## 2019-03-15 ENCOUNTER — Other Ambulatory Visit (HOSPITAL_COMMUNITY)
Admission: RE | Admit: 2019-03-15 | Discharge: 2019-03-15 | Disposition: A | Discharge status: Home / Self Care | Payer: Medicare Other | Source: Other Acute Inpatient Hospital | Attending: Internal Medicine | Admitting: Internal Medicine

## 2019-03-15 ENCOUNTER — Encounter (HOSPITAL_BASED_OUTPATIENT_CLINIC_OR_DEPARTMENT_OTHER): Payer: Medicare Other | Attending: Internal Medicine

## 2019-03-15 DIAGNOSIS — J449 Chronic obstructive pulmonary disease, unspecified: Secondary | ICD-10-CM | POA: Diagnosis not present

## 2019-03-15 DIAGNOSIS — L89314 Pressure ulcer of right buttock, stage 4: Secondary | ICD-10-CM | POA: Insufficient documentation

## 2019-03-15 DIAGNOSIS — I509 Heart failure, unspecified: Secondary | ICD-10-CM | POA: Insufficient documentation

## 2019-03-15 DIAGNOSIS — L89153 Pressure ulcer of sacral region, stage 3: Secondary | ICD-10-CM | POA: Diagnosis present

## 2019-03-16 ENCOUNTER — Other Ambulatory Visit: Payer: Self-pay

## 2019-03-18 LAB — AEROBIC CULTURE W GRAM STAIN (SUPERFICIAL SPECIMEN): Culture: NORMAL

## 2019-03-20 ENCOUNTER — Other Ambulatory Visit: Payer: Self-pay

## 2019-03-20 ENCOUNTER — Encounter (HOSPITAL_COMMUNITY): Payer: Self-pay | Admitting: Emergency Medicine

## 2019-03-20 ENCOUNTER — Emergency Department (HOSPITAL_COMMUNITY)
Admission: EM | Admit: 2019-03-20 | Discharge: 2019-03-20 | Disposition: A | Discharge status: Home / Self Care | Payer: Medicare Other | Attending: Emergency Medicine | Admitting: Emergency Medicine

## 2019-03-20 ENCOUNTER — Emergency Department (HOSPITAL_COMMUNITY): Payer: Medicare Other

## 2019-03-20 ENCOUNTER — Emergency Department (HOSPITAL_COMMUNITY)
Admission: EM | Admit: 2019-03-20 | Discharge: 2019-03-20 | Disposition: A | Discharge status: Home / Self Care | Payer: Medicare Other

## 2019-03-20 DIAGNOSIS — G8250 Quadriplegia, unspecified: Secondary | ICD-10-CM | POA: Diagnosis not present

## 2019-03-20 DIAGNOSIS — I129 Hypertensive chronic kidney disease with stage 1 through stage 4 chronic kidney disease, or unspecified chronic kidney disease: Secondary | ICD-10-CM | POA: Insufficient documentation

## 2019-03-20 DIAGNOSIS — T17908A Unspecified foreign body in respiratory tract, part unspecified causing other injury, initial encounter: Secondary | ICD-10-CM

## 2019-03-20 DIAGNOSIS — N184 Chronic kidney disease, stage 4 (severe): Secondary | ICD-10-CM | POA: Diagnosis not present

## 2019-03-20 DIAGNOSIS — Z79899 Other long term (current) drug therapy: Secondary | ICD-10-CM | POA: Insufficient documentation

## 2019-03-20 DIAGNOSIS — R627 Adult failure to thrive: Secondary | ICD-10-CM | POA: Diagnosis present

## 2019-03-20 DIAGNOSIS — Z8669 Personal history of other diseases of the nervous system and sense organs: Secondary | ICD-10-CM

## 2019-03-20 DIAGNOSIS — R6251 Failure to thrive (child): Secondary | ICD-10-CM

## 2019-03-20 LAB — CBC WITH DIFFERENTIAL/PLATELET
Abs Immature Granulocytes: 0.03 10*3/uL (ref 0.00–0.07)
Basophils Absolute: 0 10*3/uL (ref 0.0–0.1)
Basophils Relative: 1 %
Eosinophils Absolute: 0.1 10*3/uL (ref 0.0–0.5)
Eosinophils Relative: 2 %
HCT: 43.8 % (ref 39.0–52.0)
Hemoglobin: 13.6 g/dL (ref 13.0–17.0)
Immature Granulocytes: 1 %
Lymphocytes Relative: 29 %
Lymphs Abs: 1.6 10*3/uL (ref 0.7–4.0)
MCH: 27.7 pg (ref 26.0–34.0)
MCHC: 31.1 g/dL (ref 30.0–36.0)
MCV: 89.2 fL (ref 80.0–100.0)
Monocytes Absolute: 0.5 10*3/uL (ref 0.1–1.0)
Monocytes Relative: 9 %
Neutro Abs: 3.2 10*3/uL (ref 1.7–7.7)
Neutrophils Relative %: 58 %
Platelets: 205 10*3/uL (ref 150–400)
RBC: 4.91 MIL/uL (ref 4.22–5.81)
RDW: 14.6 % (ref 11.5–15.5)
WBC: 5.5 10*3/uL (ref 4.0–10.5)
nRBC: 0 % (ref 0.0–0.2)

## 2019-03-20 LAB — URINALYSIS, ROUTINE W REFLEX MICROSCOPIC
Bilirubin Urine: NEGATIVE
Glucose, UA: NEGATIVE mg/dL
Hgb urine dipstick: NEGATIVE
Ketones, ur: NEGATIVE mg/dL
Leukocytes,Ua: NEGATIVE
Nitrite: NEGATIVE
Protein, ur: NEGATIVE mg/dL
Specific Gravity, Urine: 1.018 (ref 1.005–1.030)
pH: 5 (ref 5.0–8.0)

## 2019-03-20 LAB — BASIC METABOLIC PANEL
Anion gap: 9 (ref 5–15)
BUN: 24 mg/dL — ABNORMAL HIGH (ref 8–23)
CO2: 25 mmol/L (ref 22–32)
Calcium: 9.1 mg/dL (ref 8.9–10.3)
Chloride: 104 mmol/L (ref 98–111)
Creatinine, Ser: 1.53 mg/dL — ABNORMAL HIGH (ref 0.61–1.24)
GFR calc Af Amer: 44 mL/min — ABNORMAL LOW (ref >60)
GFR calc non Af Amer: 38 mL/min — ABNORMAL LOW (ref >60)
Glucose, Bld: 92 mg/dL (ref 70–99)
Potassium: 4.4 mmol/L (ref 3.5–5.1)
Sodium: 138 mmol/L (ref 135–145)

## 2019-03-20 NOTE — ED Triage Notes (Signed)
Per EMS patient brought in from doctors office. Family states aspiration. Patient was here this morning with family seeking placement for patient. Family left with patient stating that they would seek 2nd opinion.

## 2019-03-20 NOTE — ED Provider Notes (Signed)
[facility] EMERGENCY DEPARTMENT Provider Note   CSN: [id number] Arrival date & time: 03/20/19  1555     History   Chief Complaint Chief Complaint  Patient presents with  . Questionable asperation    HPI Christopher Mcdonald is a 85 y.o. 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  He is currently living at home and has 24/7 nursing care during the week, but currently does not have weekend care.  His wife is currently at a nursing facility in [place] and his PCP is working currently on an [place], per family they are supposed to fax his paperwork tomorrow to the facility in hopes of being accepted as a resident.  Family does not feel comfortable with his being at home and feels like he needs to be in the hospital until he is placed.  His daughter Christopher Mcdonald states that he has difficulty eating, frequently chokes and coughs with food and is concerned about possible aspiration.  The patient denies being short of breath, coughing, has no chest pain and denies fevers.  Christopher Mcdonald also states that he has had copious diarrhea for the past 4 days and feels he is dehydrated.  He has had no medication for treatment of his diarrhea.  He has had no recent antibiotic use.  Patient himself denies any specific symptoms.     The history is provided by the patient and a relative (daughter Christopher Mcdonald).    Past Medical History:  Diagnosis Date  . Choledocholithiasis 02/18/2017  . Hypertension   . Influenza with pneumonia 10/26/2014  . Pancreatic divisum 02/18/2017  . Spinal cord injury, cervical region [facility])    Quadriparesis    Patient Active Problem List   Diagnosis Date Noted  . UTI (urinary tract infection) 02/19/2017  . Pressure injury of skin 02/18/2017  . CKD (chronic kidney disease), stage IV  ([place]) 02/18/2017  . Constipation 02/18/2017  . Choledocholithiasis 02/18/2017  . Pancreatic divisum 02/18/2017  . Essential hypertension 02/18/2017  . Abnormal CT of the abdomen   . LLQ pain 02/17/2017  . Cholelithiasis 02/17/2017  . Abdominal pain, acute, generalized   . Influenza with pneumonia 10/26/2014  . Renal failure (ARF), acute on chronic (HCC) 10/24/2014  . Sepsis ([place]) 10/24/2014  . Fever 10/24/2014  . Cough 10/24/2014  . Acute renal failure syndrome ([place])   . Elevated lactic acid level   . Urinary tract infectious disease   . NECK PAIN, RIGHT 12/30/2008  . DYSPHAGIA UNSPECIFIED 12/27/2008  . OTHER SEBORRHEIC DERMATITIS 10/04/2008  . PRESSURE ULCER BUTTOCK 08/24/2007  . RESTRICTIVE LUNG DISEASE 06/21/2007  . Quadriplegia ([place]) 09/08/2006  . HYPERLIPIDEMIA 06/06/2006  . DEPRESSION 06/06/2006  . CROHN'S DISEASE 06/06/2006  . IBS 06/06/2006  . KIDNEY DISEASE, CHRONIC, STAGE III 06/06/2006  . Neurogenic bladder 06/06/2006  . INCONTINENCE 06/06/2006  . SPINAL CORD INJURY 06/06/2006    Past Surgical History:  Procedure Laterality Date  . CERVICAL DISC SURGERY    . PARTIAL COLECTOMY     Dr. Irving Shows: Bowel blockage. Patient denies malignancy.  Marland Kitchen PROSTATE SURGERY          Home Medications    Prior to Admission medications   Medication Sig Start Date End Date Taking? Authorizing Provider  acetaminophen (TYLENOL) 500 MG tablet Take 1,000 mg by mouth 2 (two) times  a day.     [provider]  baclofen (LIORESAL) 10 MG tablet Take 10 mg by mouth at bedtime.  10/23/14   [provider]  cephALEXin (KEFLEX) 500 MG capsule Take 1 capsule (500 mg total) by mouth 4 (four) times daily. 02/07/19   Triplett, Tammy, PA-C  cloNIDine (CATAPRES) 0.1 MG tablet Take 0.1 mg by mouth every morning. 01/10/19   [provider]  colestipol (COLESTID) 1 g tablet Take 1-2 g by mouth See admin instructions. Take 1g in the morning and takes 1-2g in the evening  12/11/18   [provider]  polyethylene glycol (MIRALAX / GLYCOLAX) packet Take 17 g by mouth 2 (two) times daily. Patient taking differently: Take 17 g by mouth daily as needed for mild constipation or moderate constipation.  02/21/17   Samuella Cota, MD  PROAIR HFA 108 [phone] Base) MCG/ACT inhaler Inhale 1-2 puffs into the lungs every 6 (six) hours as needed for wheezing or shortness of breath.  10/04/18   [provider]    Family History No family history on file.  Social History Social History   Tobacco Use  . Smoking status: Never Smoker  . Smokeless tobacco: Never Used  Substance Use Topics  . Alcohol use: No  . Drug use: No     Allergies   Diazepam and Sulfamethoxazole-trimethoprim   Review of Systems Review of Systems  Constitutional: Negative for chills and fever.  HENT: Negative for congestion and sore throat.   Eyes: Negative.   Respiratory: Negative for chest tightness and shortness of breath.   Cardiovascular: Negative for chest pain.  Gastrointestinal: Negative for abdominal pain, nausea and vomiting.  Genitourinary: Negative.   Musculoskeletal: Negative for arthralgias, joint swelling and neck pain.  Skin: Negative.  Negative for rash and wound.       Reported chronic decubitus ulcer  Neurological: Negative for dizziness, weakness, light-headedness, numbness and headaches.  Psychiatric/Behavioral: Negative.      Physical Exam Updated Vital Signs BP (!) 146/76 (BP Location: Right Arm)   Pulse 81   Temp (!) 97.4 F (36.3 C) (Oral)   Resp 18   Ht 6' (1.829 m)   Wt 54.4 kg   SpO2 98%   BMI 16.27 kg/m   Physical Exam Vitals signs and nursing note reviewed.  Constitutional:      Appearance: He is well-developed.  HENT:     Head: Normocephalic and atraumatic.     Mouth/Throat:     Mouth: Mucous membranes are moist.  Eyes:     Conjunctiva/sclera: Conjunctivae normal.  Neck:     Musculoskeletal: Normal range of motion.   Cardiovascular:     Rate and Rhythm: Normal rate and regular rhythm.     Heart sounds: Normal heart sounds.  Pulmonary:     Effort: Pulmonary effort is normal.     Breath sounds: Normal breath sounds. No wheezing.  Abdominal:     General: Bowel sounds are normal.     Palpations: Abdomen is soft. There is no mass.     Tenderness: There is no abdominal tenderness.  Musculoskeletal: Normal range of motion.        General: No swelling, tenderness or deformity.  Skin:    General: Skin is warm and dry.     Comments: Broad sacral scarring with a small tunnel, packing in place, dressing in place.   Neurological:     Mental Status: He is alert.      ED Treatments / Results  Labs (all labs ordered are listed, but only abnormal results are displayed) Labs Reviewed  BASIC METABOLIC PANEL - Abnormal; Notable for the following components:      Result Value   BUN 24 (*)    Creatinine, Ser 1.53 (*)    GFR calc non Af Amer 38 (*)    GFR calc Af Amer 44 (*)    All other components within normal limits  CBC WITH DIFFERENTIAL/PLATELET  URINALYSIS, ROUTINE W REFLEX MICROSCOPIC    EKG None  Radiology Dg Chest 1 View  Result Date: 03/20/2019 CLINICAL DATA:  85 year old male with possible aspiration. EXAM: CHEST  1 VIEW COMPARISON:  Chest radiograph dated 02/17/2017 FINDINGS: The lungs are clear. There is no pleural effusion or pneumothorax. The cardiac silhouette is within normal limits. Osteopenia with degenerative changes of the spine. Partially visualized lower cervical ACDF. No acute osseous pathology. IMPRESSION: No active disease. Electronically Signed   By: Anner Crete M.D.   On: 03/20/2019 19:45    Procedures Procedures (including critical care time)  Medications Ordered in ED Medications - No data to display   Initial Impression / Assessment and Plan / ED Course  I have reviewed the triage vital signs and the nursing notes.  Pertinent labs & imaging results that were  available during my care of the patient were reviewed by me and considered in my medical decision making (see chart for details).        Pt with no findings per xrays, labs or exam suggesting medical need for admission.  Per daughter, pt has home care during the week, but has no resources on the weekend. Her pcp is working on the [facility] to obtain nursing home care which hopefully will occur this week.   Family informed of need to continue with pcp to get this placement arranged.  Final Clinical Impressions(s) / ED Diagnoses   Final diagnoses:  Aspiration into airway    ED Discharge Orders    None       Landis Martins 03/20/19 2148    Virgel Manifold, MD 03/25/19 1236

## 2019-03-20 NOTE — ED Notes (Signed)
Patient's daughter called, [name]. Contact information [phone].

## 2019-03-20 NOTE — ED Notes (Signed)
[facility] called to transport pt back to his residence.

## 2019-03-20 NOTE — ED Notes (Signed)
Pt picked up by [facility].

## 2019-03-20 NOTE — ED Provider Notes (Signed)
89yM brought in for evaluation by family and desire for placement. Has been quadriplegic for 17 years. His wife recently placed in rehab facility. Family wanting placement in same facility. Working with PCP on this process. Family has aids during the week but sporadic care on weekend. It is becoming increasing difficult to coordinate care and increasingly harder on them financially. I spoke with one of his children [name]. She had multiple legitimate concerns. I explained though that his medical problems are largely chronic and there doesn't appear to be an acute medical condition that I can justify hospitalizing him at this time. I listened to her concerns but explained that we couldn't continue to keep him in the ER for placement purposes. They were only able to get him to agree to going to a facility if her could be with his wife. This is certainly understandable but this could potentially take an extended period of time if they are only willing to consider a single facility. They have care during the week already established. Today is Tuesday. I encouraged her to continue to work work with the facility of their choice but to also consider possibly expanding their search. I offered to see of one of our social workers could call them and potentially help them with this process but she declined.    Virgel Manifold, MD 03/20/19 2203

## 2019-03-20 NOTE — ED Notes (Signed)
Pt presents with his son.  Son says pt is not sick but he doesn't have anyone to care for him at home and wants him placed.  [facility] because son says that is pt's pcp and was told by Gerald Stabs, Marketing executive, that pt has an appt at 2:45 today to have the [facility] filled out.  Reports they have the paper work that the son brought for the home they want pt placed.  Informed son that pt has the right to a medical screening exam and was not being turned away.  Son says he thought the appt today was just a bp check.  Pt's son left with pt to go to pcp appt.

## 2019-04-15 ENCOUNTER — Emergency Department (HOSPITAL_COMMUNITY)
Admission: EM | Admit: 2019-04-15 | Discharge: 2019-05-13 | Disposition: E | Payer: Medicare Other | Attending: Emergency Medicine | Admitting: Emergency Medicine

## 2019-04-15 ENCOUNTER — Encounter (HOSPITAL_COMMUNITY): Payer: Self-pay | Admitting: Emergency Medicine

## 2019-04-15 DIAGNOSIS — Z79899 Other long term (current) drug therapy: Secondary | ICD-10-CM | POA: Diagnosis not present

## 2019-04-15 DIAGNOSIS — Z7982 Long term (current) use of aspirin: Secondary | ICD-10-CM | POA: Diagnosis not present

## 2019-04-15 DIAGNOSIS — N184 Chronic kidney disease, stage 4 (severe): Secondary | ICD-10-CM | POA: Diagnosis not present

## 2019-04-15 DIAGNOSIS — I129 Hypertensive chronic kidney disease with stage 1 through stage 4 chronic kidney disease, or unspecified chronic kidney disease: Secondary | ICD-10-CM | POA: Insufficient documentation

## 2019-04-15 DIAGNOSIS — I469 Cardiac arrest, cause unspecified: Secondary | ICD-10-CM

## 2019-05-13 NOTE — ED Notes (Addendum)
Pt's daughter requested to cut a lock of her father's hair to take home. Assisted with this and she verbalized appreciation.

## 2019-05-13 NOTE — ED Notes (Signed)
Pt's information not immediately available on arrival. Pt arrived at 1125 with TOD 1126. No interventions in ED.

## 2019-05-13 NOTE — ED Notes (Signed)
Patient's family is in Family waiting room.  Waiting on pt's son.

## 2019-05-13 NOTE — ED Notes (Signed)
Attempted to contact pt's family.

## 2019-05-13 NOTE — ED Triage Notes (Signed)
Pt brought in from home after cpr initiated for unknown downtime after being found by caretaker. Was given epi x2 with 1 shock for vtach. No pulse on arrival to ED, code called by Dr. Roderic Palau at [phone].

## 2019-05-13 NOTE — ED Provider Notes (Addendum)
[facility] EMERGENCY DEPARTMENT Provider Note   CSN: [id number] Arrival date & time: 05/08/2019  1129     History   Chief Complaint Chief Complaint  Patient presents with  . Cardiac Arrest    HPI Christopher Mcdonald is a 85 y.o. male.     Patient was at home with his daughter and she stated she left the room for about 15 minutes and came back and he was unresponsive.  She started CPR and called paramedics.  The paramedics worked on the patient for 45 minutes and got a pulse occasionally.  Patient is a quadriplegic from a spinal cord injury and is bedbound with decubiti  The history is provided by medical records and a relative.  Cardiac Arrest Witnessed by:  Not witnessed Incident location:  Home Time since incident: Approximately 10:30 AM. Time before BLS initiated: Unknown. Time before ALS initiated: Unknown. Condition upon EMS arrival:  Unresponsive Pulse:  Absent Initial cardiac rhythm per EMS:  Asystole Treatments prior to arrival:  ACLS protocol Medications given prior to ED:  Adenosine and epinephrine Airway:  Intubation prior to arrival Rhythm on admission to ED:  Unchanged Associated symptoms: no chest pain     Past Medical History:  Diagnosis Date  . Choledocholithiasis 02/18/2017  . Hypertension   . Influenza with pneumonia 10/26/2014  . Pancreatic divisum 02/18/2017  . Spinal cord injury, cervical region [facility])    Quadriparesis    Patient Active Problem List   Diagnosis Date Noted  . UTI (urinary tract infection) 02/19/2017  . Pressure injury of skin 02/18/2017  . CKD (chronic kidney disease), stage IV ([place]) 02/18/2017  . Constipation 02/18/2017  . Choledocholithiasis 02/18/2017  . Pancreatic divisum 02/18/2017  . Essential hypertension 02/18/2017  . Abnormal CT of the abdomen   . LLQ pain 02/17/2017  . Cholelithiasis 02/17/2017  . Abdominal pain, acute, generalized   . Influenza with pneumonia 10/26/2014  . Renal failure (ARF), acute on chronic (HCC)  10/24/2014  . Sepsis ([place]) 10/24/2014  . Fever 10/24/2014  . Cough 10/24/2014  . Acute renal failure syndrome ([place])   . Elevated lactic acid level   . Urinary tract infectious disease   . NECK PAIN, RIGHT 12/30/2008  . DYSPHAGIA UNSPECIFIED 12/27/2008  . OTHER SEBORRHEIC DERMATITIS 10/04/2008  . PRESSURE ULCER BUTTOCK 08/24/2007  . RESTRICTIVE LUNG DISEASE 06/21/2007  . Quadriplegia ([place]) 09/08/2006  . HYPERLIPIDEMIA 06/06/2006  . DEPRESSION 06/06/2006  . CROHN'S DISEASE 06/06/2006  . IBS 06/06/2006  . KIDNEY DISEASE, CHRONIC, STAGE III 06/06/2006  . Neurogenic bladder 06/06/2006  . INCONTINENCE 06/06/2006  . SPINAL CORD INJURY 06/06/2006    Past Surgical History:  Procedure Laterality Date  . CERVICAL DISC SURGERY    . PARTIAL COLECTOMY     Dr. Irving Shows: Bowel blockage. Patient denies malignancy.  Marland Kitchen PROSTATE SURGERY          Home Medications    Prior to Admission medications   Medication Sig Start Date End Date Taking? Authorizing Provider  acetaminophen (TYLENOL) 500 MG tablet Take 500-1,000 mg by mouth 2 (two) times a day.     [provider]  aspirin EC 81 MG tablet Take 81 mg by mouth daily.    [provider]  baclofen (LIORESAL) 10 MG tablet Take 10 mg by mouth 2 (two) times daily.  10/23/14   [provider]  cephALEXin (KEFLEX) 500 MG capsule Take 1 capsule (500 mg total) by mouth 4 (four) times daily. Patient not taking: Reported on  03/20/2019 02/07/19   Triplett, Tammy, PA-C  cloNIDine (CATAPRES) 0.1 MG tablet Take 0.1 mg by mouth every evening.  01/10/19   [provider]  colestipol (COLESTID) 1 g tablet Take 2 g by mouth daily at 12 noon.  12/11/18   [provider]  cyanocobalamin (,VITAMIN B-12,) 1000 MCG/ML injection Inject 1,000 mcg into the muscle every 30 (thirty) days.    [provider]  folic acid (FOLVITE) Q000111Q MCG tablet Take 800 mcg by mouth every morning.    [provider]  polyethylene  glycol (MIRALAX / GLYCOLAX) packet Take 17 g by mouth 2 (two) times daily. Patient taking differently: Take 17 g by mouth daily as needed for mild constipation or moderate constipation.  02/21/17   Samuella Cota, MD  PROAIR HFA 108 [phone] Base) MCG/ACT inhaler Inhale 1-2 puffs into the lungs every 6 (six) hours as needed for wheezing or shortness of breath.  10/04/18   [provider]    Family History History reviewed. No pertinent family history.  Social History Social History   Tobacco Use  . Smoking status: Never Smoker  . Smokeless tobacco: Never Used  Substance Use Topics  . Alcohol use: No  . Drug use: No     Allergies   Diazepam and Sulfamethoxazole-trimethoprim   Review of Systems Review of Systems  Unable to perform ROS: Mental status change  Cardiovascular: Negative for chest pain.     Physical Exam Updated Vital Signs There were no vitals taken for this visit.  Physical Exam Vitals signs and nursing note reviewed.  Constitutional:      Appearance: He is well-developed.     Comments: Unresponsive  HENT:     Head: Normocephalic.     Comments: Pupils fixed and dilated    Nose: Nose normal.  Eyes:     General: No scleral icterus.    Conjunctiva/sclera: Conjunctivae normal.  Neck:     Musculoskeletal: Neck supple.     Thyroid: No thyromegaly.  Cardiovascular:     Heart sounds: No murmur. No friction rub. No gallop.      Comments: CPR being done.  Patient has no pulse Pulmonary:     Breath sounds: No stridor. No wheezing or rales.     Comments: Patient being bagged Chest:     Chest wall: No tenderness.  Abdominal:     General: There is no distension.     Tenderness: There is no abdominal tenderness. There is no rebound.  Musculoskeletal: Normal range of motion.  Lymphadenopathy:     Cervical: No cervical adenopathy.  Skin:    Findings: No erythema or rash.  Neurological:     Motor: No abnormal muscle tone.     Coordination:  Coordination normal.     Comments: Patient unresponsive      ED Treatments / Results  Labs (all labs ordered are listed, but only abnormal results are displayed) Labs Reviewed - No data to display  EKG None  Radiology No results found.  Procedures Procedures (including critical care time)  Medications Ordered in ED Medications - No data to display   Initial Impression / Assessment and Plan / ED Course  I have reviewed the triage vital signs and the nursing notes.  Pertinent labs & imaging results that were available during my care of the patient were reviewed by me and considered in my medical decision making (see chart for details). CRITICAL CARE Performed by: Milton Ferguson Total critical care time: 20 minutes Critical  care time was exclusive of separately billable procedures and treating other patients. Critical care was necessary to treat or prevent imminent or life-threatening deterioration. Critical care was time spent personally by me on the following activities: development of treatment plan with patient and/or surrogate as well as nursing, discussions with consultants, evaluation of patient's response to treatment, examination of patient, obtaining history from patient or surrogate, ordering and performing treatments and interventions, ordering and review of laboratory studies, ordering and review of radiographic studies, pulse oximetry and re-evaluation of patient's condition.        Patient pronounced dead at 11:26 AM.  Patient is a quadriplegic that is bedbound.  Patient arrested at home and was last seen prior to the arrest by his daughter approximately 1520 minutes before the arrest.  CPR was started and then paramedics were called and ACLS was started.  Patient never regained consciousness.  He did get a pulse occasionally.  When patient arrived in the ER he did not have a pulse.  He was pronounced dead at 11:26 AM. Dr. Jani Gravel will sign the death certificate  Final Clinical Impressions(s) / ED Diagnoses   Final diagnoses:  Cardiac arrest [facility])    ED Discharge Orders    None       Milton Ferguson, MD Apr 21, 2019 1327    Milton Ferguson, MD 2019-04-21 1339

## 2019-05-13 DEATH — deceased

## 2020-03-15 IMAGING — DX DG ABDOMEN ACUTE W/ 1V CHEST
4 series · 4 of 4 positions shown · non-contrast
Comparison: CT of 03/31/2017

CLINICAL DATA: Hypotension and constipation

EXAM:
DG ABDOMEN ACUTE W/ 1V CHEST

[abdomen erect]
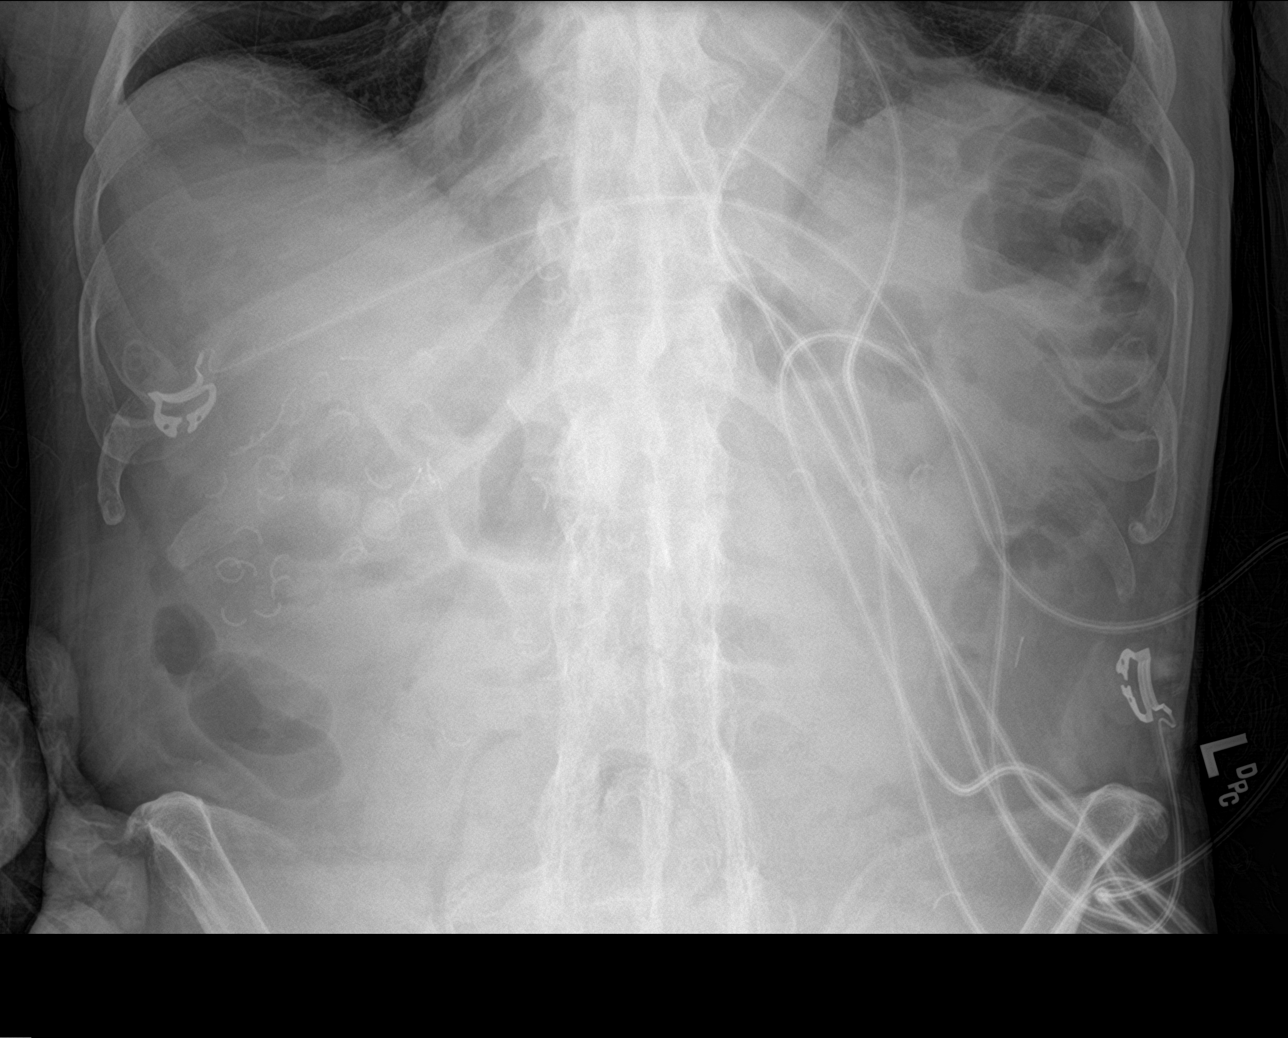

[abdomen supine (1 of 2)]
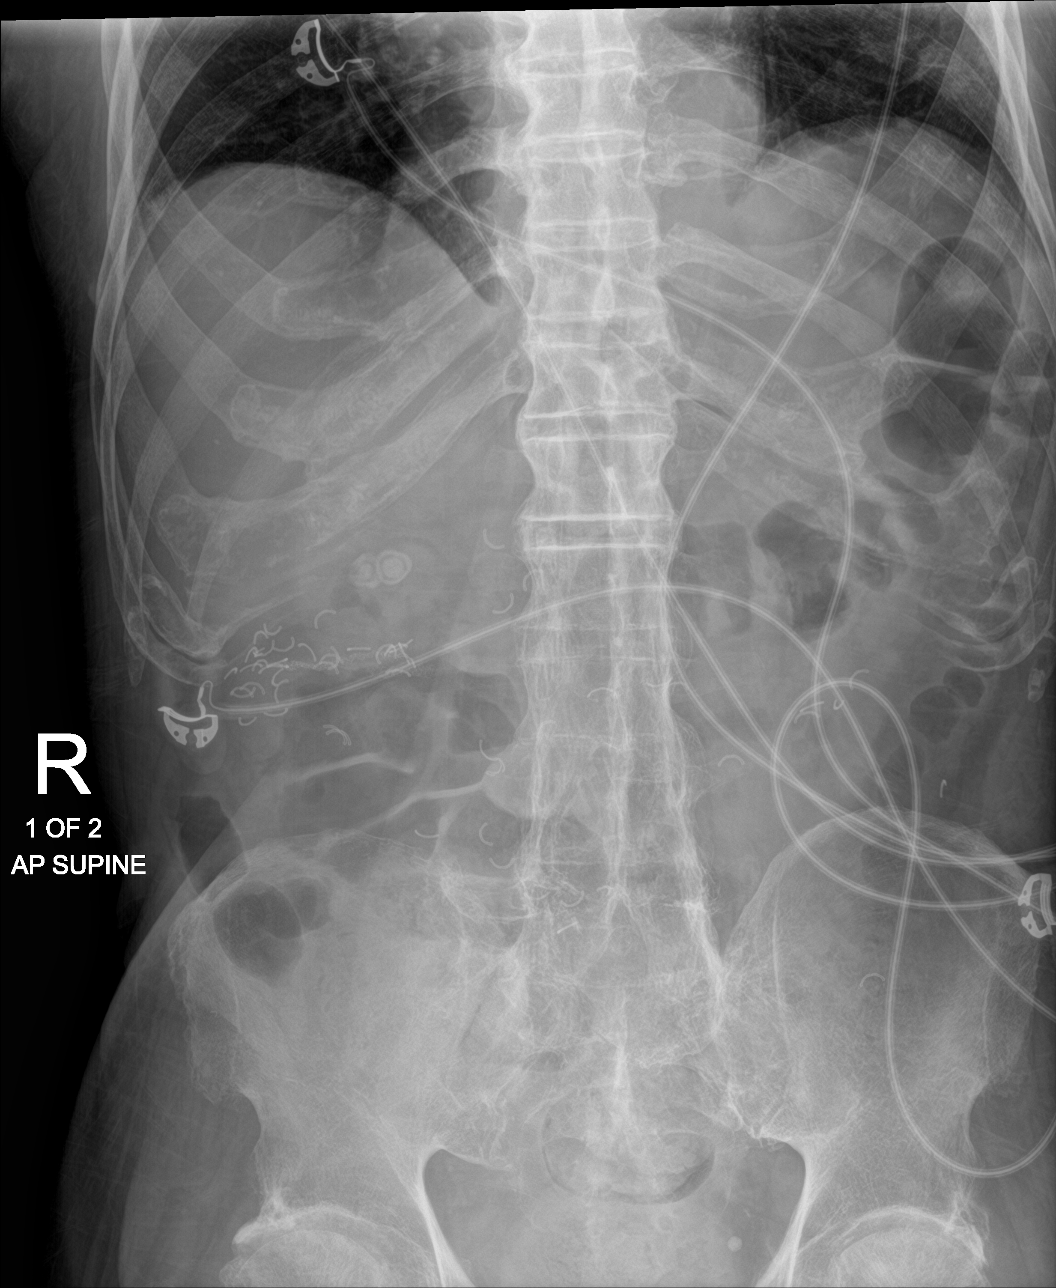

[chest ap]
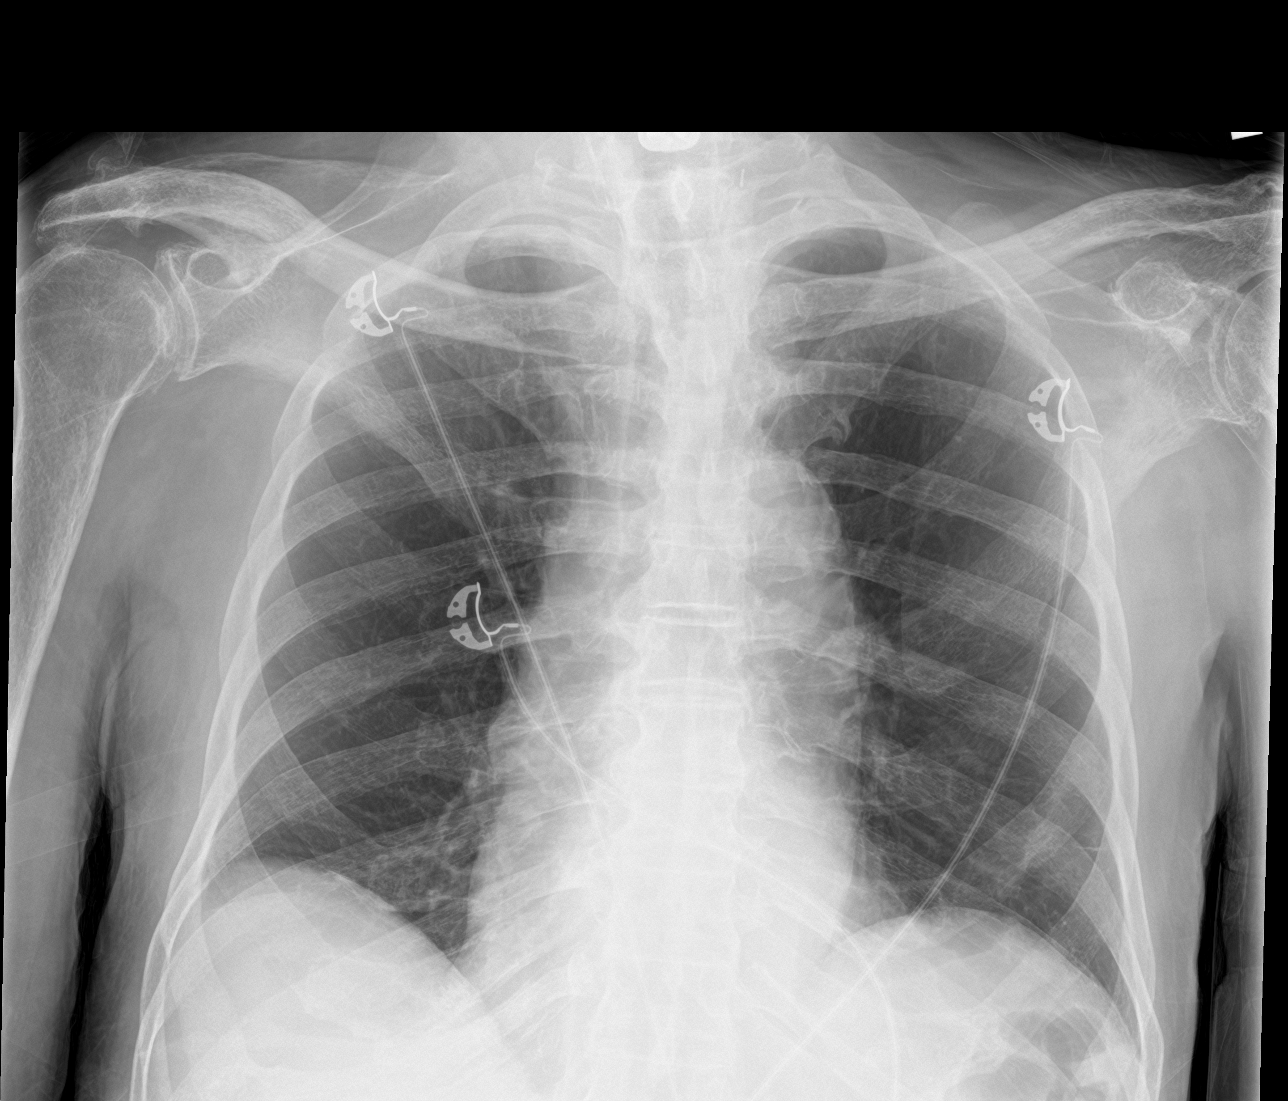

[abdomen supine (2 of 2)]
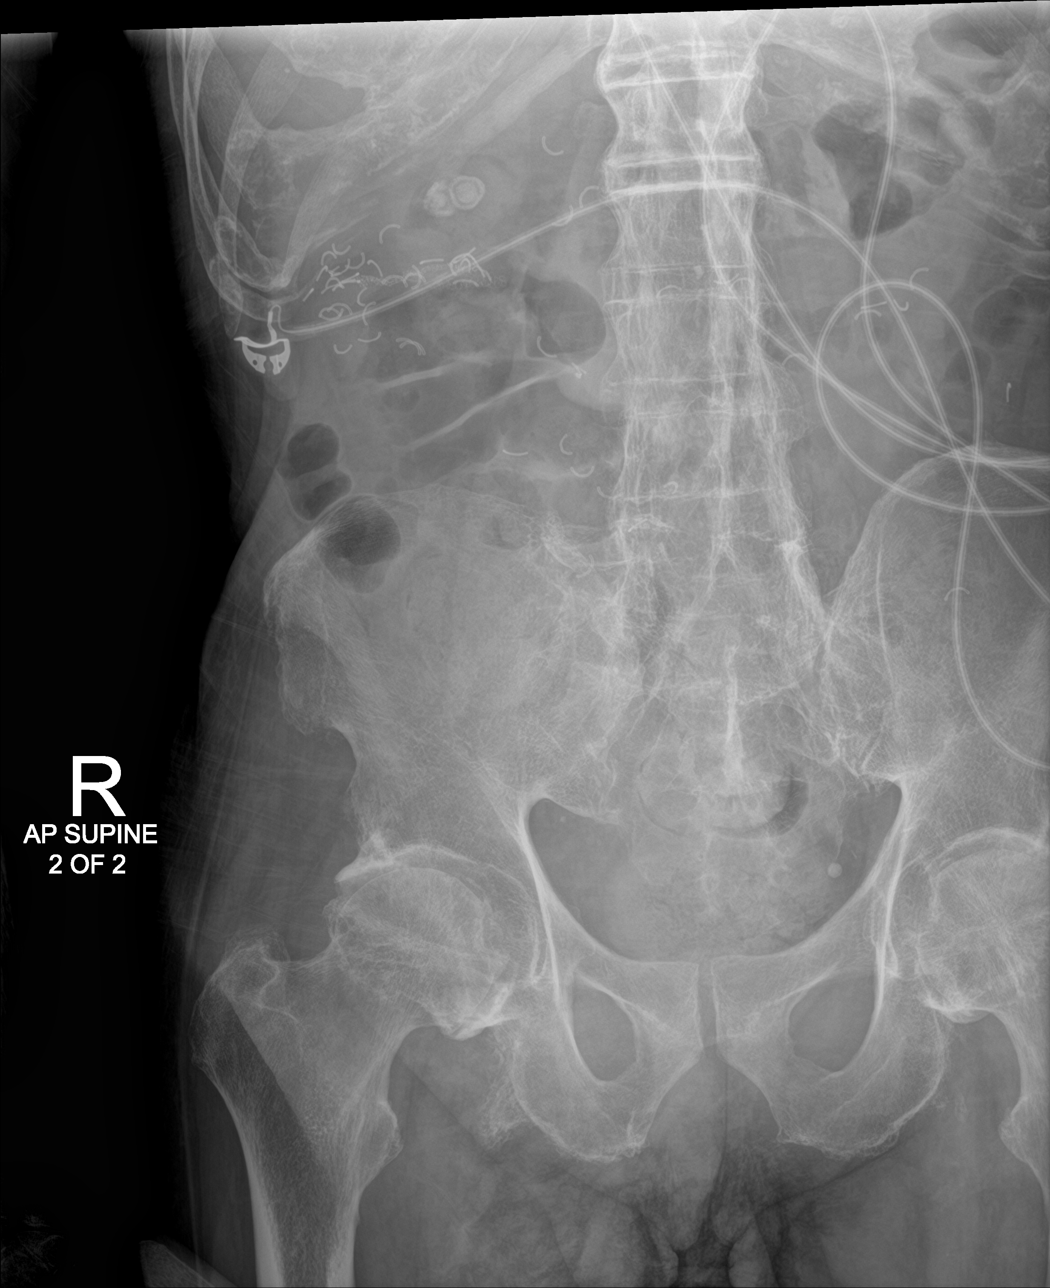

[4 of 4 positions shown; findings below may reference images not displayed]

FINDINGS: There is no evidence of dilated bowel loops or free intraperitoneal
air. Again noted are multiple surgical clips overlying the abdomen.
Heart size and mediastinal contours are within normal limits. Both
lungs are clear. There is diffuse demineralization of the osseous
structures. Cervical spinal fixation hardware is seen.
Osteoarthritis is seen at bilateral shoulders and bilateral hips.
IMPRESSION: 1. Nonobstructive bowel gas pattern.
2. No acute cardiopulmonary process.

## 2020-04-25 IMAGING — DX DG CHEST 1V
1 series · 1 of 1 positions shown · non-contrast
Comparison: Chest radiograph dated 02/17/2017

CLINICAL DATA: [AGE] male with possible aspiration.

EXAM:
CHEST  1 VIEW

[chest ap]
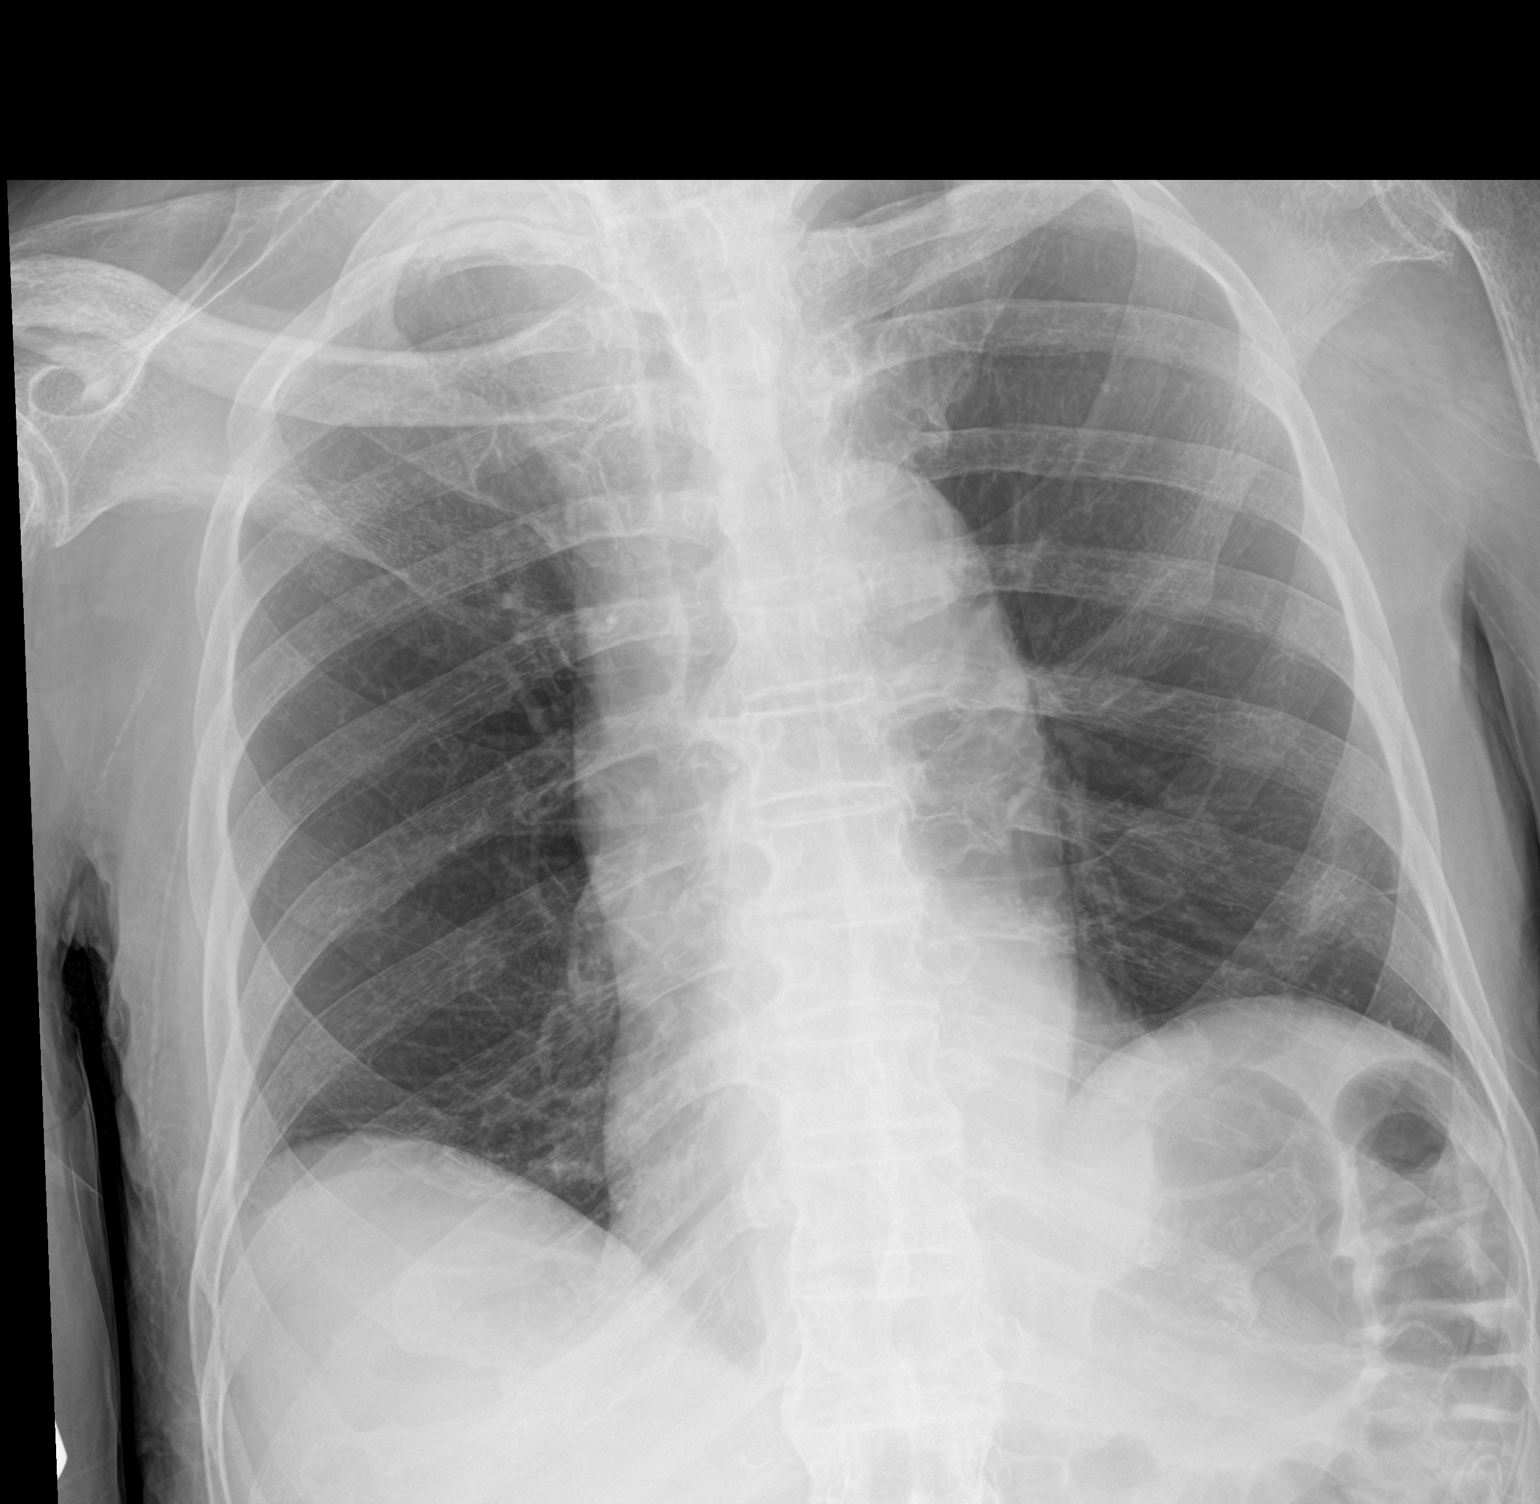

[1 of 1 positions shown; findings below may reference images not displayed]

FINDINGS: The lungs are clear. There is no pleural effusion or pneumothorax.
The cardiac silhouette is within normal limits. Osteopenia with
degenerative changes of the spine. Partially visualized lower
cervical ACDF. No acute osseous pathology.
IMPRESSION: No active disease.
